# Patient Record
Sex: Male | Born: 1960 | Smoking: Never smoker
Health system: Southern US, Community
[De-identification: ages and names within clinical notes are randomized; demographics above are authoritative.]

## PROBLEM LIST (undated history)

## (undated) DIAGNOSIS — N200 Calculus of kidney: Secondary | ICD-10-CM

## (undated) DIAGNOSIS — G8929 Other chronic pain: Secondary | ICD-10-CM

## (undated) DIAGNOSIS — K635 Polyp of colon: Secondary | ICD-10-CM

## (undated) DIAGNOSIS — T7840XA Allergy, unspecified, initial encounter: Secondary | ICD-10-CM

## (undated) DIAGNOSIS — K921 Melena: Secondary | ICD-10-CM

## (undated) DIAGNOSIS — M1990 Unspecified osteoarthritis, unspecified site: Secondary | ICD-10-CM

## (undated) DIAGNOSIS — M549 Dorsalgia, unspecified: Secondary | ICD-10-CM

## (undated) DIAGNOSIS — K602 Anal fissure, unspecified: Secondary | ICD-10-CM

## (undated) HISTORY — DX: Calculus of kidney: N20.0

## (undated) HISTORY — DX: Anal fissure, unspecified: K60.2

## (undated) HISTORY — DX: Other chronic pain: G89.29

## (undated) HISTORY — DX: Melena: K92.1

## (undated) HISTORY — DX: Polyp of colon: K63.5

## (undated) HISTORY — PX: NOSE SURGERY: SHX723

## (undated) HISTORY — DX: Other chronic pain: M54.9

## (undated) HISTORY — DX: Allergy, unspecified, initial encounter: T78.40XA

## (undated) HISTORY — DX: Unspecified osteoarthritis, unspecified site: M19.90

---

## 1998-02-18 LAB — HM COLONOSCOPY

## 2008-06-24 ENCOUNTER — Emergency Department: Payer: Self-pay | Admitting: Emergency Medicine

## 2008-08-10 ENCOUNTER — Ambulatory Visit: Payer: Self-pay | Admitting: Unknown Physician Specialty

## 2008-09-17 ENCOUNTER — Ambulatory Visit: Payer: Self-pay | Admitting: Anesthesiology

## 2008-10-08 ENCOUNTER — Ambulatory Visit: Payer: Self-pay | Admitting: Anesthesiology

## 2008-11-01 ENCOUNTER — Ambulatory Visit: Payer: Self-pay | Admitting: Anesthesiology

## 2009-02-11 ENCOUNTER — Ambulatory Visit: Payer: Self-pay | Admitting: Anesthesiology

## 2012-02-19 ENCOUNTER — Encounter: Payer: Self-pay | Admitting: Internal Medicine

## 2012-02-19 ENCOUNTER — Ambulatory Visit (INDEPENDENT_AMBULATORY_CARE_PROVIDER_SITE_OTHER): Payer: Managed Care, Other (non HMO) | Admitting: Internal Medicine

## 2012-02-19 VITALS — BP 152/100 | HR 84 | Temp 98.9°F | Ht 72.0 in | Wt 209.5 lb

## 2012-02-19 DIAGNOSIS — R229 Localized swelling, mass and lump, unspecified: Secondary | ICD-10-CM

## 2012-02-19 DIAGNOSIS — Z0000 Encounter for general adult medical examination without abnormal findings: Secondary | ICD-10-CM

## 2012-02-19 DIAGNOSIS — K921 Melena: Secondary | ICD-10-CM

## 2012-02-19 DIAGNOSIS — M25512 Pain in left shoulder: Secondary | ICD-10-CM

## 2012-02-19 DIAGNOSIS — Z1211 Encounter for screening for malignant neoplasm of colon: Secondary | ICD-10-CM

## 2012-02-19 DIAGNOSIS — Z23 Encounter for immunization: Secondary | ICD-10-CM

## 2012-02-19 DIAGNOSIS — M25519 Pain in unspecified shoulder: Secondary | ICD-10-CM

## 2012-02-19 LAB — CBC WITH DIFFERENTIAL/PLATELET
Basophils Absolute: 0 10*3/uL (ref 0.0–0.1)
Basophils Relative: 1 % (ref 0–1)
Eosinophils Absolute: 0.2 10*3/uL (ref 0.0–0.7)
Eosinophils Relative: 4 % (ref 0–5)
HCT: 44.5 % (ref 39.0–52.0)
Hemoglobin: 15.4 g/dL (ref 13.0–17.0)
Lymphocytes Relative: 25 % (ref 12–46)
Lymphs Abs: 1.5 10*3/uL (ref 0.7–4.0)
MCH: 29.6 pg (ref 26.0–34.0)
MCHC: 34.6 g/dL (ref 30.0–36.0)
MCV: 85.4 fL (ref 78.0–100.0)
Monocytes Absolute: 0.5 10*3/uL (ref 0.1–1.0)
Monocytes Relative: 9 % (ref 3–12)
Neutro Abs: 3.6 10*3/uL (ref 1.7–7.7)
Neutrophils Relative %: 61 % (ref 43–77)
Platelets: 233 10*3/uL (ref 150–400)
RBC: 5.21 MIL/uL (ref 4.22–5.81)
RDW: 13.4 % (ref 11.5–15.5)
WBC: 5.8 10*3/uL (ref 4.0–10.5)

## 2012-02-19 LAB — LIPID PANEL
Cholesterol: 203 mg/dL — ABNORMAL HIGH (ref 0–200)
HDL: 43 mg/dL (ref >39)
LDL Cholesterol: 135 mg/dL — ABNORMAL HIGH (ref 0–99)
Total CHOL/HDL Ratio: 4.7 Ratio
Triglycerides: 123 mg/dL (ref <150)
VLDL: 25 mg/dL (ref 0–40)

## 2012-02-19 LAB — COMPREHENSIVE METABOLIC PANEL
ALT: 23 U/L (ref 0–53)
AST: 17 U/L (ref 0–37)
Albumin: 4.3 g/dL (ref 3.5–5.2)
Alkaline Phosphatase: 46 U/L (ref 39–117)
BUN: 15 mg/dL (ref 6–23)
CO2: 31 mEq/L (ref 19–32)
Calcium: 9.5 mg/dL (ref 8.4–10.5)
Chloride: 102 mEq/L (ref 96–112)
Creat: 1.26 mg/dL (ref 0.50–1.35)
Glucose, Bld: 82 mg/dL (ref 70–99)
Potassium: 4.2 mEq/L (ref 3.5–5.3)
Sodium: 142 mEq/L (ref 135–145)
Total Bilirubin: 0.5 mg/dL (ref 0.3–1.2)
Total Protein: 7 g/dL (ref 6.0–8.3)

## 2012-02-19 NOTE — Progress Notes (Signed)
Subjective:    Patient ID: Tony Scott, male    DOB: [birth year], 51 y.o.   MRN: [id number]  HPI 51 year old male presents to establish care. His primary concern today is mass on his right upper chest. He reports that he was scratching his chest to 3 weeks ago when he noticed a lump over his right upper chest. He denies any injury to that area. He denies any other lumps or masses. He does not think that the lump has changed in size. It is not painful. He denies any other symptoms such as fatigue, change in appetite, change in bowel habits, pain. He reports he is generally feeling well.  He also notes a chronic history of intermittent bloody stools. He reports that he has these several times per year and there is typically enough blood to be noticeable in the toilet. There may be an occasional blood clot. He denies constipation or diarrhea. He denies pain with passing stool. His last colonoscopy was approximately 1998. He reports this was normal except for some noncancerous polyps. He was told to followup in 5 years but has not been so. Notably, his mother died of colon cancer.  He also reports several month history of left shoulder pain. He reports that this first began after carrying his luggage for a long period of time. He reports pain and difficulty abducting his arm. He denies any swelling in his left shoulder. He denies any weakness in his left arm.  Outpatient Encounter Prescriptions as of 02/19/2012  Medication Sig Dispense Refill  . Ascorbic Acid (VITAMIN C) 1000 MG tablet Take 1,000 mg by mouth daily.      Marland Kitchen loratadine-pseudoephedrine (CLARITIN-D 24-HOUR) 10-240 MG per 24 hr tablet Take 1 tablet by mouth daily.        Review of Systems  Constitutional: Negative for fever, chills, activity change, appetite change, fatigue and unexpected weight change.  Eyes: Negative for visual disturbance.  Respiratory: Negative for cough and shortness of breath.   Cardiovascular: Negative for chest pain,  palpitations and leg swelling.  Gastrointestinal: Positive for blood in stool. Negative for abdominal pain and abdominal distention.  Genitourinary: Negative for dysuria, urgency and difficulty urinating.  Musculoskeletal: Negative for arthralgias and gait problem.  Skin: Negative for color change and rash.  Hematological: Negative for adenopathy.  Psychiatric/Behavioral: Negative for disturbed wake/sleep cycle and dysphoric mood. The patient is not nervous/anxious.    BP 152/100  Pulse 84  Temp 98.9 F (37.2 C) (Oral)  Ht 6' (1.829 m)  Wt 209 lb 8 oz (95.029 kg)  BMI 28.41 kg/m2  SpO2 98%     Objective:   Physical Exam  Constitutional: He is oriented to person, place, and time. He appears well-developed and well-nourished. No distress.  HENT:  Head: Normocephalic and atraumatic.  Right Ear: External ear normal.  Left Ear: External ear normal.  Nose: Nose normal.  Mouth/Throat: Oropharynx is clear and moist. No oropharyngeal exudate.  Eyes: Conjunctivae and EOM are normal. Pupils are equal, round, and reactive to light. Right eye exhibits no discharge. Left eye exhibits no discharge. No scleral icterus.  Neck: Normal range of motion. Neck supple. No tracheal deviation present. No thyromegaly present.  Cardiovascular: Normal rate, regular rhythm and normal heart sounds.  Exam reveals no gallop and no friction rub.   No murmur heard. Pulmonary/Chest: Effort normal and breath sounds normal. No respiratory distress. He has no wheezes. He has no rales. He exhibits no tenderness. Right breast exhibits mass. Right breast exhibits  no inverted nipple, no nipple discharge, no skin change and no tenderness. Left breast exhibits no inverted nipple, no mass, no nipple discharge, no skin change and no tenderness. Breasts are symmetrical.    Abdominal: Soft. Bowel sounds are normal. He exhibits no distension and no mass. There is no tenderness. There is no guarding.  Musculoskeletal: He exhibits  no edema.       Left shoulder: He exhibits decreased range of motion, tenderness and pain. He exhibits normal strength.  Lymphadenopathy:    He has no cervical adenopathy.       Right: No supraclavicular adenopathy present.       Left: No supraclavicular adenopathy present.  Neurological: He is alert and oriented to person, place, and time. No cranial nerve deficit. Coordination normal.  Skin: Skin is warm and dry. No rash noted. He is not diaphoretic. No erythema. No pallor.  Psychiatric: He has a normal mood and affect. His behavior is normal. Judgment and thought content normal.          Assessment & Plan:

## 2012-02-19 NOTE — Assessment & Plan Note (Signed)
Symptoms concerning for internal hemorrhoids versus diverticulitis versus malignancy. Notably, his mother died from colon cancer. He is overdue for colonoscopy. Will set up GI evaluation with colonoscopy.

## 2012-02-19 NOTE — Assessment & Plan Note (Signed)
Left shoulder pain consistent with rotator cuff tear. No improvement with conservative measures including rest, icing, and nonsteroidals. Will set up MRI left shoulder for further evaluation.

## 2012-02-19 NOTE — Assessment & Plan Note (Signed)
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 Will get general surgical evaluation for biopsy.

## 2012-02-23 ENCOUNTER — Telehealth: Payer: Self-pay | Admitting: Internal Medicine

## 2012-02-23 NOTE — Telephone Encounter (Signed)
Left message for patient informing him that his insurance would not cover his MRI of his shoulder until he has had 4 weeks of physical therapy.

## 2012-03-07 ENCOUNTER — Telehealth: Payer: Self-pay | Admitting: Internal Medicine

## 2012-03-07 DIAGNOSIS — M25519 Pain in unspecified shoulder: Secondary | ICD-10-CM

## 2012-03-07 NOTE — Telephone Encounter (Signed)
The patient is consenting to have physical therapy and if physical therapy does not work he will go forth with the MRI . Could you put in a referral for physical Therapy?

## 2012-03-10 ENCOUNTER — Encounter: Payer: Self-pay | Admitting: Internal Medicine

## 2012-03-13 ENCOUNTER — Encounter: Payer: Self-pay | Admitting: Internal Medicine

## 2012-03-18 ENCOUNTER — Ambulatory Visit: Payer: Managed Care, Other (non HMO) | Admitting: Internal Medicine

## 2012-03-22 ENCOUNTER — Ambulatory Visit: Payer: Managed Care, Other (non HMO) | Admitting: Internal Medicine

## 2012-03-24 ENCOUNTER — Encounter: Payer: Self-pay | Admitting: Internal Medicine

## 2012-03-25 ENCOUNTER — Encounter: Payer: Self-pay | Admitting: Internal Medicine

## 2012-03-25 ENCOUNTER — Ambulatory Visit (INDEPENDENT_AMBULATORY_CARE_PROVIDER_SITE_OTHER): Payer: Managed Care, Other (non HMO) | Admitting: Internal Medicine

## 2012-03-25 VITALS — BP 160/90 | HR 72 | Ht 73.25 in | Wt 211.5 lb

## 2012-03-25 DIAGNOSIS — Z86010 Personal history of colonic polyps: Secondary | ICD-10-CM

## 2012-03-25 DIAGNOSIS — Z1211 Encounter for screening for malignant neoplasm of colon: Secondary | ICD-10-CM

## 2012-03-25 MED ORDER — PEG-KCL-NACL-NASULF-NA ASC-C 100 G PO SOLR: 1.0000 | Freq: Once | ORAL | Status: DC

## 2012-03-25 NOTE — Patient Instructions (Addendum)
You have been scheduled for a colonoscopy with propofol. Please follow written instructions given to you at your visit today.  Please pick up your prep kit at the pharmacy within the next 1-3 days. If you use inhalers (even only as needed), please bring them with you on the day of your procedure.  We have sent the following medications to your pharmacy for you to pick up at your convenience: moviprep  

## 2012-03-25 NOTE — Progress Notes (Signed)
Patient ID: Tony Scott, male   DOB: December 20, 1960, 51 y.o.   MRN: [id number]  SUBJECTIVE: HPI Tony Scott is a 51 yo male with PMH of kidney stones, colon polyps of unclear, anal fissure, chronic back pain who seen in consultation at the request of Dr. Dan Humphreys for evaluation of high risk colon cancer screening. The patient is alone today. Overall he is doing well with out specific GI complaint. He does have a history of intermittent, but rare rectal bleeding. This is bright red when it occurs and painless. In the past he's been told he has anal fissure, but he does not feel this is an issue now. He denies abdominal pain. No nausea or vomiting. No heartburn. No trouble swallowing. No melena. He reports having previously had a colonoscopy in approximately 1997, and he recalls having several "benign polyps" removed. He was advised to followup in 5 years with repeat screening, but this did not occur.  His mother has a history of colorectal cancer around age 73  Review of Systems  As per history of present illness, otherwise negative   Past Medical History  Diagnosis Date  . Blood in stool   . Allergy     Hay fever  . Kidney stones   . Colon polyp   . Back pain, chronic     abnormality L4-5, per pt  . Anal fissure   . Arthritis     Current Outpatient Prescriptions  Medication Sig Dispense Refill  . Ascorbic Acid (VITAMIN C) 1000 MG tablet Take 1,000 mg by mouth daily.      Marland Kitchen loratadine-pseudoephedrine (CLARITIN-D 24-HOUR) 10-240 MG per 24 hr tablet Take 1 tablet by mouth daily.      . Potassium 99 MG TABS Take 1 tablet by mouth daily.      . peg 3350 powder (MOVIPREP) 100 G SOLR Take 1 kit (100 g total) by mouth once.  1 kit  0    No Known Allergies  Family History  Problem Relation Age of Onset  . Colon cancer Mother   . Heart disease Mother   . Hypertension Mother   . Diabetes Mother   . Hyperlipidemia Father   . Heart disease Father   . Hypertension Father   . Diabetes Father   .  Arthritis Maternal Grandmother   . Hyperlipidemia Maternal Grandmother   . Heart disease Maternal Grandmother   . Hypertension Maternal Grandmother   . Arthritis Maternal Grandfather   . Hyperlipidemia Maternal Grandfather   . Heart disease Maternal Grandfather   . Hypertension Maternal Grandfather   . Rheum arthritis Sister     History  Substance Use Topics  . Smoking status: Never Smoker   . Smokeless tobacco: Never Used  . Alcohol Use: Yes     social    OBJECTIVE: BP 160/90  Pulse 72  Ht 6' 1.25" (1.861 m)  Wt 211 lb 8 oz (95.936 kg)  BMI 27.71 kg/m2 Constitutional: Well-developed and well-nourished. No distress. HEENT: Normocephalic and atraumatic. Oropharynx is clear and moist. No oropharyngeal exudate. Conjunctivae are normal. Pupils are equal round and reactive to light. No scleral icterus. Neck: Neck supple. Trachea midline. Cardiovascular: Normal rate, regular rhythm and intact distal pulses. No M/R/G Pulmonary/chest: Effort normal and breath sounds normal. No wheezing, rales or rhonchi. Abdominal: Soft, nontender, nondistended. Bowel sounds active throughout. There are no masses palpable. No hepatosplenomegaly. Extremities: no clubbing, cyanosis, or edema Lymphadenopathy: No cervical adenopathy noted. Neurological: Alert and oriented to person place and time. Skin:  Skin is warm and dry. No rashes noted. Psychiatric: Normal mood and affect. Behavior is normal.  Labs and Imaging -- CBC    Component Value Date/Time   WBC 5.8 02/19/2012 1540   RBC 5.21 02/19/2012 1540   HGB 15.4 02/19/2012 1540   HCT 44.5 02/19/2012 1540   PLT 233 02/19/2012 1540   MCV 85.4 02/19/2012 1540   MCH 29.6 02/19/2012 1540   MCHC 34.6 02/19/2012 1540   RDW 13.4 02/19/2012 1540   LYMPHSABS 1.5 02/19/2012 1540   MONOABS 0.5 02/19/2012 1540   EOSABS 0.2 02/19/2012 1540   BASOSABS 0.0 02/19/2012 1540    CMP     Component Value Date/Time   NA 142 02/19/2012 1540   K 4.2 02/19/2012 1540   CL 102 02/19/2012 1540    CO2 31 02/19/2012 1540   GLUCOSE 82 02/19/2012 1540   BUN 15 02/19/2012 1540   CREATININE 1.26 02/19/2012 1540   CALCIUM 9.5 02/19/2012 1540   PROT 7.0 02/19/2012 1540   ALBUMIN 4.3 02/19/2012 1540   AST 17 02/19/2012 1540   ALT 23 02/19/2012 1540   ALKPHOS 46 02/19/2012 1540   BILITOT 0.5 02/19/2012 1540    ASSESSMENT AND PLAN: 51 yo male with PMH of kidney stones, colon polyps of unclear, anal fissure, chronic back pain who seen in consultation at the request of Dr. Dan Humphreys for evaluation of high risk colon cancer screening.   1. High risk CRC screening/surveillance/hx of colon polyps -- the patient is at elevated risk given his family history of colon cancer in first-degree relatives (mother). He also has a history of colon polyps, subtype unknown, but at this point he is due for colonoscopy either way, and therefore understanding the histology of these polyps is not necessary. He feels he has these records at home, and we'll try to send these to [place] for completeness. I recommended colonoscopy at this time and he is agreeable. We discussed the risks and benefits. Further recommendations can be made after colonoscopy, and his screening interval should be every 5 years. He is aware of this recommendation.

## 2012-03-28 ENCOUNTER — Telehealth: Payer: Self-pay

## 2012-03-28 ENCOUNTER — Encounter: Payer: Self-pay | Admitting: Internal Medicine

## 2012-03-28 ENCOUNTER — Telehealth: Payer: Self-pay | Admitting: Internal Medicine

## 2012-03-28 NOTE — Telephone Encounter (Signed)
I spoke with patient and he stated in the past when he had back pain they gave him a shot of Toradol and Depo-Medrol and it really helped.  At first he stated he just wanted a new Rx for cyclobenzaprine because he also took that in the past, but then he decided he would like to be seen to possibly get an injection of Toradol and Depo-Medrol.  He made an appt for tomorrow.

## 2012-03-28 NOTE — Telephone Encounter (Signed)
Does pt need refill or want new medication?

## 2012-03-28 NOTE — Telephone Encounter (Signed)
Pt came in today wanting to get a referral for physical therpy for back .  When he went to pt for his shoulder this though his back out.   Pt also wanted a rx  For cyclobenzaprine 10mg  or oxycodone 5mg   This work well the last time he had back pain   2009 [facility]  Additional number [id number]

## 2012-03-29 ENCOUNTER — Ambulatory Visit (INDEPENDENT_AMBULATORY_CARE_PROVIDER_SITE_OTHER): Payer: Managed Care, Other (non HMO) | Admitting: Internal Medicine

## 2012-03-29 ENCOUNTER — Encounter: Payer: Self-pay | Admitting: Internal Medicine

## 2012-03-29 ENCOUNTER — Ambulatory Visit (INDEPENDENT_AMBULATORY_CARE_PROVIDER_SITE_OTHER)
Admission: RE | Admit: 2012-03-29 | Discharge: 2012-03-29 | Disposition: A | Discharge status: Home / Self Care | Payer: Managed Care, Other (non HMO) | Source: Ambulatory Visit | Attending: Internal Medicine | Admitting: Internal Medicine

## 2012-03-29 VITALS — BP 160/111 | HR 65 | Temp 98.3°F | Resp 16 | Wt 210.2 lb

## 2012-03-29 DIAGNOSIS — M545 Low back pain, unspecified: Secondary | ICD-10-CM

## 2012-03-29 MED ORDER — CYCLOBENZAPRINE HCL 10 MG PO TABS: 10.0000 mg | ORAL_TABLET | Freq: Three times a day (TID) | ORAL | Status: DC | PRN

## 2012-03-29 MED ORDER — OXYCODONE-ACETAMINOPHEN 5-325 MG PO TABS: 1.0000 | ORAL_TABLET | Freq: Three times a day (TID) | ORAL | Status: DC | PRN

## 2012-03-29 MED ORDER — KETOROLAC TROMETHAMINE 60 MG/2ML IM SOLN
60.0000 mg | Freq: Once | INTRAMUSCULAR | Status: AC
  Administered 2012-03-29: 60 mg via INTRAMUSCULAR

## 2012-03-29 MED ORDER — PREDNISONE (PAK) 10 MG PO TABS: ORAL_TABLET | ORAL | Status: DC

## 2012-03-29 NOTE — Assessment & Plan Note (Addendum)
Patient with severe lower back pain secondary to degenerative changes in the lumbar spine. X-ray today shows chronic degenerative changes. Will get MRI of the lumbar spine for further evaluation. Given that he has had no improvement with conservative measures including chiropractic care, anti-inflammatory medicines, physical therapy, question if he might benefit from surgical intervention or epidural steroid injections. Will give shot of Toradol today. Will also start Prednisone taper, Flexeril and use Percocet as needed for severe pain. Followup 4 weeks.

## 2012-03-29 NOTE — Progress Notes (Signed)
Subjective:    Patient ID: Tony Scott, male    DOB: August 28, 1960, 51 y.o.   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 Pain is described as aching in the low back. It does not radiate generally to his legs. There is no weakness in the legs. There is no loss of control of bowel or bladder. Patient has been evaluated extensively in the past with x-rays, chiropractic care, physical therapy, and treatment with anti-inflammatory medications. Symptoms have been well-controlled over the last 10-20 years for the most part but intermittently he will have exacerbations of pain. Over the last couple of weeks pain has been severe and limited ability to function. He denies any recent trauma to his back. Pain is worsened by leaning forward.  Outpatient Encounter Prescriptions as of 03/29/2012  Medication Sig Dispense Refill  . Ascorbic Acid (VITAMIN C) 1000 MG tablet Take 1,000 mg by mouth daily.      Marland Kitchen loratadine-pseudoephedrine (CLARITIN-D 24-HOUR) 10-240 MG per 24 hr tablet Take 1 tablet by mouth daily.      . Potassium 99 MG TABS Take 1 tablet by mouth daily.      . cyclobenzaprine (FLEXERIL) 10 MG tablet Take 1 tablet (10 mg total) by mouth 3 (three) times daily as needed for muscle spasms.  90 tablet  3  . oxyCODONE-acetaminophen (ROXICET) 5-325 MG per tablet Take 1 tablet by mouth every 8 (eight) hours as needed for pain.  20 tablet  0  . peg 3350 powder (MOVIPREP) 100 G SOLR Take 1 kit (100 g total) by mouth once.  1 kit  0  . predniSONE (STERAPRED UNI-PAK) 10 MG tablet Take 60mg  day 1 then taper by 10mg  daily  21 tablet  0   Facility-Administered Encounter Medications as of 03/29/2012  Medication Dose Route Frequency Provider Last Rate Last Dose  . ketorolac (TORADOL) injection 60 mg  60 mg Intramuscular  Once Shelia Media, MD   60 mg at 03/29/12 1216   BP 160/111  Pulse 65  Temp 98.3 F (36.8 C) (Oral)  Resp 16  Wt 210 lb 4 oz (95.369 kg)  SpO2 99%  Review of Systems  Constitutional: Negative for fever, chills and fatigue.  Musculoskeletal: Positive for myalgias, back pain and arthralgias. Negative for joint swelling and gait problem.  Neurological: Negative for dizziness, weakness, numbness and headaches.       Objective:   Physical Exam  Constitutional: He is oriented to person, place, and time. He appears well-developed and well-nourished. No distress.  HENT:  Head: Normocephalic and atraumatic.  Right Ear: External ear normal.  Left Ear: External ear normal.  Nose: Nose normal.  Mouth/Throat: Oropharynx is clear and moist. No oropharyngeal exudate.  Eyes: Conjunctivae normal and EOM are normal. Pupils are equal, round, and reactive to light. Right eye exhibits no discharge. Left eye exhibits no discharge. No scleral icterus.  Neck: Normal range of motion. Neck supple. No tracheal deviation present. No thyromegaly present.  Pulmonary/Chest: Effort normal.  Musculoskeletal: He exhibits no edema.       Lumbar back: He exhibits decreased range of motion, tenderness, pain and spasm. He exhibits no edema.  Lymphadenopathy:    He has no cervical adenopathy.  Neurological: He is alert and oriented to person, place, and time. No cranial nerve deficit. Coordination normal.  Skin: Skin is warm and dry. No rash noted. He is not diaphoretic. No erythema. No pallor.  Psychiatric: He has a normal mood and affect. His behavior is normal. Judgment and thought content normal.          Assessment & Plan:

## 2012-04-01 ENCOUNTER — Encounter: Payer: Self-pay | Admitting: Internal Medicine

## 2012-04-01 ENCOUNTER — Ambulatory Visit (AMBULATORY_SURGERY_CENTER): Payer: Managed Care, Other (non HMO) | Admitting: Internal Medicine

## 2012-04-01 VITALS — BP 154/110 | HR 57 | Temp 97.4°F | Resp 18 | Ht 73.0 in | Wt 211.0 lb

## 2012-04-01 DIAGNOSIS — D126 Benign neoplasm of colon, unspecified: Secondary | ICD-10-CM

## 2012-04-01 DIAGNOSIS — Z800 Family history of malignant neoplasm of digestive organs: Secondary | ICD-10-CM

## 2012-04-01 DIAGNOSIS — Z1211 Encounter for screening for malignant neoplasm of colon: Secondary | ICD-10-CM

## 2012-04-01 DIAGNOSIS — Z86010 Personal history of colonic polyps: Secondary | ICD-10-CM

## 2012-04-01 LAB — HM COLONOSCOPY

## 2012-04-01 MED ORDER — SODIUM CHLORIDE 0.9 % IV SOLN: 500.0000 mL | INTRAVENOUS | Status: DC

## 2012-04-01 NOTE — Op Note (Signed)
[facility] [address] [place], [address]   COLONOSCOPY PROCEDURE REPORT  PATIENT: Tony Scott, Tony Scott  MR#: [id number] BIRTHDATE: [birth year] , 51  yrs. old GENDER: Male ENDOSCOPIST: Beverley Fiedler, MD REFERRED [id number], Jennifer PROCEDURE DATE:  04/01/2012 PROCEDURE:   Colonoscopy with snare polypectomy ASA CLASS:   Class II INDICATIONS:elevated risk screening, patient's immediate family history of colon cancer, and high risk patient with personal history of colonic polyps. MEDICATIONS: MAC sedation, administered by CRNA and Propofol (Diprivan) 240 mg IV  DESCRIPTION OF PROCEDURE:   After the risks benefits and alternatives of the procedure were thoroughly explained, informed consent was obtained.  A digital rectal exam revealed no rectal mass.   The LB [id number]  endoscope was introduced through the anus and advanced to the terminal ileum which was intubated for a short distance. No adverse events experienced.   The quality of the prep was good, using MoviPrep  The instrument was then slowly withdrawn as the colon was fully examined.   COLON FINDINGS: Three sessile polyps measuring 4-5 mm in size were found in the transverse colon and rectum.  Polypectomy was performed using cold snare.  All resections were complete and all polyp tissue was completely retrieved.   Moderate diverticulosis was noted in the descending colon and sigmoid colon.   Small internal hemorrhoids were found.   The mucosa appeared normal in the terminal ileum.  Retroflexed views revealed internal hemorrhoids. The time to cecum=3 minutes 04 seconds.  Withdrawal time=11 minutes 07 seconds.  The scope was withdrawn and the procedure completed. COMPLICATIONS: There were no complications.  ENDOSCOPIC IMPRESSION: 1.   Three sessile polyps measuring 4-5 mm in size were found in the transverse colon and rectum; Polypectomy was performed using cold snare 2.   Moderate diverticulosis was  noted in the descending colon and sigmoid colon 3.   Small internal hemorrhoids 4.   Normal mucosa in the terminal ileum  RECOMMENDATIONS: 1.  Hold aspirin, aspirin products, and anti-inflammatory medication for 1 week. 2.  High fiber diet 3.  If the polyps removed today are proven to be adenomatous (pre-cancerous) polyps, you will need a colonoscopy in 3 years. Otherwise, given your significant family history of colon cancer, you should have a repeat colonoscopy in 5 year.  You will receive a letter within 1-2 weeks with the results of your biopsy as well as final recommendations.  Please call my office if you have not received a letter after 3 weeks.    eSigned:  Beverley Fiedler, MD 04/01/2012 12:31 PM   cc: Ronna Polio MD and The Patient   PATIENT NAME:  Tony Scott, Tony Scott MR#: [id number]

## 2012-04-01 NOTE — Progress Notes (Addendum)
Patient did not have preoperative order for IV antibiotic SSI prophylaxis. (G8918)  Patient did not experience any of the following events: a burn prior to discharge; a fall within the facility; wrong site/side/patient/procedure/implant event; or a hospital transfer or hospital admission upon discharge from the facility. (G8907)  

## 2012-04-01 NOTE — Patient Instructions (Signed)
YOU HAD AN ENDOSCOPIC PROCEDURE TODAY AT THE  ENDOSCOPY CENTER: Refer to the procedure report that was given to you for any specific questions about what was found during the examination.  If the procedure report does not answer your questions, please call your gastroenterologist to clarify.  If you requested that your care partner not be given the details of your procedure findings, then the procedure report has been included in a sealed envelope for you to review at your convenience later.  YOU SHOULD EXPECT: Some feelings of bloating in the abdomen. Passage of more gas than usual.  Walking can help get rid of the air that was put into your GI tract during the procedure and reduce the bloating. If you had a lower endoscopy (such as a colonoscopy or flexible sigmoidoscopy) you may notice spotting of blood in your stool or on the toilet paper. If you underwent a bowel prep for your procedure, then you may not have a normal bowel movement for a few days.  DIET: Your first meal following the procedure should be a light meal and then it is ok to progress to your normal diet.  A half-sandwich or bowl of soup is an example of a good first meal.  Heavy or fried foods are harder to digest and may make you feel nauseous or bloated.  Likewise meals heavy in dairy and vegetables can cause extra gas to form and this can also increase the bloating.  Drink plenty of fluids but you should avoid alcoholic beverages for 24 hours.  ACTIVITY: Your care partner should take you home directly after the procedure.  You should plan to take it easy, moving slowly for the rest of the day.  You can resume normal activity the day after the procedure however you should NOT DRIVE or use heavy machinery for 24 hours (because of the sedation medicines used during the test).    SYMPTOMS TO REPORT IMMEDIATELY: A gastroenterologist can be reached at any hour.  During normal business hours, 8:30 AM to 5:00 PM Monday through Friday,  call [phone].  After hours and on weekends, please call the GI answering service at [phone] who will take a message and have the physician on call contact you.   Following lower endoscopy (colonoscopy or flexible sigmoidoscopy):  Excessive amounts of blood in the stool  Significant tenderness or worsening of abdominal pains  Swelling of the abdomen that is new, acute  Fever of 100F or higher    FOLLOW UP: If any biopsies were taken you will be contacted by phone or by letter within the next 1-3 weeks.  Call your gastroenterologist if you have not heard about the biopsies in 3 weeks.  Our staff will call the home number listed on your records the next business day following your procedure to check on you and address any questions or concerns that you may have at that time regarding the information given to you following your procedure. This is a courtesy call and so if there is no answer at the home number and we have not heard from you through the emergency physician on call, we will assume that you have returned to your regular daily activities without incident.  SIGNATURES/CONFIDENTIALITY: You and/or your care partner have signed paperwork which will be entered into your electronic medical record.  These signatures attest to the fact that that the information above on your After Visit Summary has been reviewed and is understood.  Full responsibility of the confidentiality   of this discharge information lies with you and/or your care-partner.    INFORMATION ON POLYPS ,DIVERTICULOSIS, HEMORRHOIDS, &HIGH FIBER DIET GIVEN TO YOU TODAY  

## 2012-04-04 ENCOUNTER — Telehealth: Payer: Self-pay | Admitting: *Deleted

## 2012-04-04 NOTE — Telephone Encounter (Signed)
TELEPHONE DISCONNECTED, UNABLE TO LEAVE MESSAGE.

## 2012-04-05 ENCOUNTER — Encounter: Payer: Self-pay | Admitting: Internal Medicine

## 2012-04-11 ENCOUNTER — Encounter: Payer: Self-pay | Admitting: Internal Medicine

## 2012-04-11 DIAGNOSIS — M549 Dorsalgia, unspecified: Secondary | ICD-10-CM

## 2012-05-06 ENCOUNTER — Encounter: Payer: Managed Care, Other (non HMO) | Admitting: Internal Medicine

## 2012-05-10 ENCOUNTER — Encounter: Payer: Self-pay | Admitting: Internal Medicine

## 2012-05-11 ENCOUNTER — Telehealth: Payer: Self-pay

## 2012-05-11 NOTE — Telephone Encounter (Signed)
Pt request cd of back xray to be picked up after 05/11/12 by girlfriend Vicky.

## 2012-07-04 ENCOUNTER — Encounter: Payer: Self-pay | Admitting: Internal Medicine

## 2012-07-04 ENCOUNTER — Ambulatory Visit (INDEPENDENT_AMBULATORY_CARE_PROVIDER_SITE_OTHER): Payer: Managed Care, Other (non HMO) | Admitting: Internal Medicine

## 2012-07-04 VITALS — BP 140/82 | HR 81 | Temp 98.4°F | Resp 16 | Wt 214.5 lb

## 2012-07-04 DIAGNOSIS — J0100 Acute maxillary sinusitis, unspecified: Secondary | ICD-10-CM

## 2012-07-04 MED ORDER — AMOXICILLIN-POT CLAVULANATE 875-125 MG PO TABS: 1.0000 | ORAL_TABLET | Freq: Two times a day (BID) | ORAL | Status: DC

## 2012-07-04 NOTE — Assessment & Plan Note (Signed)
Symptoms most consistent with maxillary sinusitis. Will treat with Augmentin. Patient will restart Claritin-D. He will use ibuprofen as needed for pain. He will call if symptoms are not improving over the next 72 hours.

## 2012-07-04 NOTE — Progress Notes (Signed)
Subjective:    Patient ID: Tony Scott, male    DOB: 1960/09/26, 51 y.o.   MRN: [id number]  HPI 51 year old male with history of recurrent sinusitis presents for acute visit complaining of 2-3 week history of right-sided maxillary sinus pain and pressure. He reports that symptoms have been intermittent over the last couple of weeks. He has had some pain across his face with purulent nasal drainage. He also has occasional ear pain. Symptoms are consistent with previous episodes of sinusitis. He denies any fever, chills, cough, shortness of breath. He has not been taking any medication for symptoms.  Outpatient Encounter Prescriptions as of 07/04/2012  Medication Sig Dispense Refill  . Ascorbic Acid (VITAMIN C) 1000 MG tablet Take 1,000 mg by mouth daily.      Marland Kitchen loratadine-pseudoephedrine (CLARITIN-D 24-HOUR) 10-240 MG per 24 hr tablet Take 1 tablet by mouth daily.      . Potassium 99 MG TABS Take 1 tablet by mouth daily.      Marland Kitchen amoxicillin-clavulanate (AUGMENTIN) 875-125 MG per tablet Take 1 tablet by mouth 2 (two) times daily.  20 tablet  0  . cyclobenzaprine (FLEXERIL) 10 MG tablet Take 1 tablet (10 mg total) by mouth 3 (three) times daily as needed for muscle spasms.  90 tablet  3  . oxyCODONE-acetaminophen (ROXICET) 5-325 MG per tablet Take 1 tablet by mouth every 8 (eight) hours as needed for pain.  20 tablet  0  . [DISCONTINUED] predniSONE (STERAPRED UNI-PAK) 10 MG tablet Take 60mg  day 1 then taper by 10mg  daily  21 tablet  0   BP 140/82  Pulse 81  Temp 98.4 F (36.9 C) (Oral)  Resp 16  Wt 214 lb 8 oz (97.297 kg)  SpO2 95%   Review of Systems  Constitutional: Negative for fever, chills, activity change, appetite change, fatigue and unexpected weight change.  HENT: Positive for congestion, postnasal drip and sinus pressure.   Eyes: Negative for visual disturbance.  Respiratory: Negative for cough and shortness of breath.   Cardiovascular: Negative for chest pain, palpitations and  leg swelling.  Gastrointestinal: Negative for abdominal pain and abdominal distention.  Genitourinary: Negative for dysuria, urgency and difficulty urinating.  Musculoskeletal: Negative for arthralgias and gait problem.  Skin: Negative for color change and rash.  Hematological: Negative for adenopathy.  Psychiatric/Behavioral: Negative for sleep disturbance and dysphoric mood. The patient is not nervous/anxious.        Objective:   Physical Exam  Constitutional: He is oriented to person, place, and time. He appears well-developed and well-nourished. No distress.  HENT:  Head: Normocephalic and atraumatic.  Right Ear: External ear normal. A middle ear effusion is present.  Left Ear: External ear normal. A middle ear effusion is present.  Nose: Mucosal edema present. Right sinus exhibits maxillary sinus tenderness.  Mouth/Throat: Oropharynx is clear and moist. No oropharyngeal exudate.  Eyes: Conjunctivae normal and EOM are normal. Pupils are equal, round, and reactive to light. Right eye exhibits no discharge. Left eye exhibits no discharge. No scleral icterus.  Neck: Normal range of motion. Neck supple. No tracheal deviation present. No thyromegaly present.  Cardiovascular: Normal rate, regular rhythm and normal heart sounds.  Exam reveals no gallop and no friction rub.   No murmur heard. Pulmonary/Chest: Effort normal and breath sounds normal. No respiratory distress. He has no wheezes. He has no rales. He exhibits no tenderness.  Musculoskeletal: Normal range of motion. He exhibits no edema.  Lymphadenopathy:    He has no cervical adenopathy.  Neurological: He is alert and oriented to person, place, and time. No cranial nerve deficit. Coordination normal.  Skin: Skin is warm and dry. No rash noted. He is not diaphoretic. No erythema. No pallor.  Psychiatric: He has a normal mood and affect. His behavior is normal. Judgment and thought content normal.          Assessment & Plan:

## 2012-08-27 ENCOUNTER — Other Ambulatory Visit: Payer: Self-pay

## 2013-03-09 ENCOUNTER — Encounter: Payer: Self-pay | Admitting: Internal Medicine

## 2013-03-09 ENCOUNTER — Ambulatory Visit (INDEPENDENT_AMBULATORY_CARE_PROVIDER_SITE_OTHER): Payer: Managed Care, Other (non HMO) | Admitting: Internal Medicine

## 2013-03-09 VITALS — BP 120/90 | HR 69 | Temp 98.8°F | Ht 73.0 in | Wt 214.0 lb

## 2013-03-09 DIAGNOSIS — R030 Elevated blood-pressure reading, without diagnosis of hypertension: Secondary | ICD-10-CM

## 2013-03-09 DIAGNOSIS — Z0000 Encounter for general adult medical examination without abnormal findings: Secondary | ICD-10-CM | POA: Insufficient documentation

## 2013-03-09 DIAGNOSIS — IMO0001 Reserved for inherently not codable concepts without codable children: Secondary | ICD-10-CM

## 2013-03-09 NOTE — Progress Notes (Signed)
  Subjective:    Patient ID: Tony Scott, male    DOB: April 27, 1961, 52 y.o.   MRN: [id number]  HPI 52YO male with h/o chronic low back pain presents for annual exam. Doing well. No concerns today. Tries to follow healthy diet. Exercises by playing golf. UTD on health maintenance, including colonoscopy which was performed 03/2012.  Outpatient Encounter Prescriptions as of 03/09/2013  Medication Sig Dispense Refill  . Ascorbic Acid (VITAMIN C) 1000 MG tablet Take 1,000 mg by mouth daily.      Marland Kitchen loratadine-pseudoephedrine (CLARITIN-D 24-HOUR) 10-240 MG per 24 hr tablet Take 1 tablet by mouth daily.      . Misc Natural Products (OSTEO BI-FLEX JOINT SHIELD PO) Take by mouth.      . Potassium 99 MG TABS Take 1 tablet by mouth daily.       No facility-administered encounter medications on file as of 03/09/2013.   BP 120/90  Pulse 69  Temp(Src) 98.8 F (37.1 C) (Oral)  Ht 6\' 1"  (1.854 m)  Wt 214 lb (97.07 kg)  BMI 28.24 kg/m2  SpO2 97%  Review of Systems  Constitutional: Negative for fever, chills, activity change, appetite change, fatigue and unexpected weight change.  Eyes: Negative for visual disturbance.  Respiratory: Negative for cough and shortness of breath.   Cardiovascular: Negative for chest pain, palpitations and leg swelling.  Gastrointestinal: Negative for abdominal pain and abdominal distention.  Genitourinary: Negative for dysuria, urgency and difficulty urinating.  Musculoskeletal: Positive for myalgias, back pain and arthralgias. Negative for gait problem.  Skin: Negative for color change and rash.  Hematological: Negative for adenopathy.  Psychiatric/Behavioral: Negative for sleep disturbance and dysphoric mood. The patient is not nervous/anxious.        Objective:   Physical Exam  Constitutional: He is oriented to person, place, and time. He appears well-developed and well-nourished. No distress.  HENT:  Head: Normocephalic and atraumatic.  Right Ear: External ear  normal.  Left Ear: External ear normal.  Nose: Nose normal.  Mouth/Throat: Oropharynx is clear and moist. No oropharyngeal exudate.  Eyes: Conjunctivae and EOM are normal. Pupils are equal, round, and reactive to light. Right eye exhibits no discharge. Left eye exhibits no discharge. No scleral icterus.  Neck: Normal range of motion. Neck supple. No tracheal deviation present. No thyromegaly present.  Cardiovascular: Normal rate, regular rhythm and normal heart sounds.  Exam reveals no gallop and no friction rub.   No murmur heard. Pulmonary/Chest: Effort normal and breath sounds normal. No respiratory distress. He has no wheezes. He has no rales. He exhibits no tenderness.  Abdominal: Soft. Bowel sounds are normal. He exhibits no distension. There is no tenderness. There is no rebound and no guarding.  Musculoskeletal: Normal range of motion. He exhibits no edema.  Lymphadenopathy:    He has no cervical adenopathy.  Neurological: He is alert and oriented to person, place, and time. No cranial nerve deficit. Coordination normal.  Skin: Skin is warm and dry. No rash noted. He is not diaphoretic. No erythema. No pallor.  Psychiatric: He has a normal mood and affect. His behavior is normal. Judgment and thought content normal.          Assessment & Plan:

## 2013-03-09 NOTE — Assessment & Plan Note (Signed)
General medical exam normal today. Health maintenance is UTD. Encouraged continued effort at healthy diet, low in saturated fat and processed carbohydrates, high in fiber. Encouraged goal of exercise 3x per week. Will check CMP, lipids with labs today.

## 2013-03-09 NOTE — Assessment & Plan Note (Signed)
BP Readings from Last 3 Encounters:  03/09/13 120/90  07/04/12 140/82  04/01/12 154/110   BP slightly elevated today and has been elevated in the past. Will have pt monitor at home and call if BP consistently >140/90. Will also have him return for BP recheck in 1-2 weeks. Will check renal function with labs.

## 2013-03-10 LAB — COMPREHENSIVE METABOLIC PANEL
ALT: 33 U/L (ref 0–53)
AST: 26 U/L (ref 0–37)
Albumin: 4.7 g/dL (ref 3.5–5.2)
Alkaline Phosphatase: 44 U/L (ref 39–117)
BUN: 17 mg/dL (ref 6–23)
CO2: 32 mEq/L (ref 19–32)
Calcium: 9.9 mg/dL (ref 8.4–10.5)
Chloride: 105 mEq/L (ref 96–112)
Creatinine, Ser: 1 mg/dL (ref 0.4–1.5)
GFR: 85.82 mL/min (ref >60.00)
Glucose, Bld: 89 mg/dL (ref 70–99)
Potassium: 5.3 mEq/L — ABNORMAL HIGH (ref 3.5–5.1)
Sodium: 141 mEq/L (ref 135–145)
Total Bilirubin: 1.3 mg/dL — ABNORMAL HIGH (ref 0.3–1.2)
Total Protein: 7.9 g/dL (ref 6.0–8.3)

## 2013-03-10 LAB — LIPID PANEL
Cholesterol: 214 mg/dL — ABNORMAL HIGH (ref 0–200)
HDL: 48.8 mg/dL (ref >39.00)
Total CHOL/HDL Ratio: 4
Triglycerides: 90 mg/dL (ref 0.0–149.0)
VLDL: 18 mg/dL (ref 0.0–40.0)

## 2013-03-10 LAB — LDL CHOLESTEROL, DIRECT: Direct LDL: 165.3 mg/dL

## 2013-03-10 LAB — PSA, TOTAL AND FREE
PSA, Free Pct: 33 % (ref >25)
PSA, Free: 0.46 ng/mL
PSA: 1.41 ng/mL (ref <=4.00)

## 2013-03-20 ENCOUNTER — Telehealth: Payer: Self-pay | Admitting: *Deleted

## 2013-03-20 ENCOUNTER — Other Ambulatory Visit (INDEPENDENT_AMBULATORY_CARE_PROVIDER_SITE_OTHER): Payer: Managed Care, Other (non HMO)

## 2013-03-20 DIAGNOSIS — R7402 Elevation of levels of lactic acid dehydrogenase (LDH): Secondary | ICD-10-CM

## 2013-03-20 LAB — COMPREHENSIVE METABOLIC PANEL
ALT: 23 U/L (ref 0–53)
AST: 19 U/L (ref 0–37)
Albumin: 4.4 g/dL (ref 3.5–5.2)
Alkaline Phosphatase: 50 U/L (ref 39–117)
BUN: 17 mg/dL (ref 6–23)
CO2: 30 mEq/L (ref 19–32)
Calcium: 9.7 mg/dL (ref 8.4–10.5)
Chloride: 106 mEq/L (ref 96–112)
Creatinine, Ser: 0.9 mg/dL (ref 0.4–1.5)
GFR: 99.77 mL/min (ref >60.00)
Glucose, Bld: 90 mg/dL (ref 70–99)
Potassium: 4.9 mEq/L (ref 3.5–5.1)
Sodium: 142 mEq/L (ref 135–145)
Total Bilirubin: 0.9 mg/dL (ref 0.3–1.2)
Total Protein: 7 g/dL (ref 6.0–8.3)

## 2013-03-20 NOTE — Telephone Encounter (Signed)
CMP 790.4 

## 2013-03-20 NOTE — Telephone Encounter (Signed)
What labs and dx? Is it just for a cmp? If so what dx?

## 2013-05-18 ENCOUNTER — Other Ambulatory Visit: Payer: Self-pay

## 2013-09-04 ENCOUNTER — Encounter: Payer: Self-pay | Admitting: Adult Health

## 2013-09-04 ENCOUNTER — Ambulatory Visit (INDEPENDENT_AMBULATORY_CARE_PROVIDER_SITE_OTHER): Payer: Managed Care, Other (non HMO) | Admitting: Adult Health

## 2013-09-04 ENCOUNTER — Ambulatory Visit (INDEPENDENT_AMBULATORY_CARE_PROVIDER_SITE_OTHER)
Admission: RE | Admit: 2013-09-04 | Discharge: 2013-09-04 | Disposition: A | Discharge status: Home / Self Care | Payer: Managed Care, Other (non HMO) | Source: Ambulatory Visit | Attending: Adult Health | Admitting: Adult Health

## 2013-09-04 VITALS — BP 118/82 | HR 66 | Temp 98.1°F | Resp 14 | Wt 191.0 lb

## 2013-09-04 DIAGNOSIS — R222 Localized swelling, mass and lump, trunk: Secondary | ICD-10-CM

## 2013-09-04 DIAGNOSIS — M542 Cervicalgia: Secondary | ICD-10-CM | POA: Insufficient documentation

## 2013-09-04 NOTE — Progress Notes (Signed)
   Subjective:    Patient ID: Tony Scott, male    DOB: Dec 01, 1960, 53 y.o.   MRN: [id number]  HPI  Pt is a pleasant 53 y/o male who presents with the following concerns:  1) He has recently noticed a lump at the end of the breast bone. Initially it was tender but no longer so. 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  2) He has pain in the lower cervical spine/thoracic spine that has recently developed. He reports pain as sharp. He reports flying on a weekly basis and noticed while he was looking downward while reading, etc. There are not palpable areas on his neck reported.  Review of Systems  Musculoskeletal: Positive for neck pain.       Hard area at the end of breast bone  Neurological: Negative for numbness.  All other systems reviewed and are negative.       Objective:   Physical Exam  Constitutional: He is oriented to person, place, and time. He appears well-developed and well-nourished. No distress.  Cardiovascular: Normal rate and regular rhythm.   Pulmonary/Chest: Effort normal. No respiratory distress.  Musculoskeletal: Normal range of motion. He exhibits no edema and no tenderness.  Hard, palpable mass at the area of xyphoid process. Neck with good ROM. Palpable area that is tender to touch at the lower end of cervical spine top of thoracic spine.  Neurological: He is alert and oriented to person, place, and time.  Skin: Skin is warm and dry.  Psychiatric: He has a normal mood and affect. His behavior is normal. Judgment and thought content normal.   BP 118/82  Pulse 66  Temp(Src) 98.1 F (36.7 C) (Oral)  Resp 14  Wt 191 lb (86.637 kg)  SpO2 99%      Assessment & Plan:   1. Neck pain Xray area to evaluate for arthritis, narrowing or loss of disc space. Appears to be positional. - DG Cervical Spine Complete; Future  2. Mass in chest Mass in the area of xyphoid process. Evaluate for bony abnormality vs benign  finding - osteoma.   - DG Chest Port 1 View; Future

## 2013-09-04 NOTE — Progress Notes (Signed)
Pre visit review using our clinic review tool, if applicable. No additional management support is needed unless otherwise documented below in the visit note. 

## 2013-10-20 ENCOUNTER — Ambulatory Visit: Payer: Self-pay | Admitting: Internal Medicine

## 2014-01-31 ENCOUNTER — Telehealth: Payer: Self-pay | Admitting: Internal Medicine

## 2014-01-31 NOTE — Telephone Encounter (Signed)
Patient Information:  Caller Name: Vanessa  Phone: [phone]  Patient: Tony Scott, Tony Scott  Gender: Male  DOB: 12-26-1960  Age: 53 Years  PCP: Ronette Deter (Adults only)  Office Follow Up:  Does the office need to follow up with this patient?: No  Instructions For The Office: N/A  RN Note:  Reviewed Home care advice regarding Gout  and call back parameters with patient. Understanding expressed.  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  Please review.  he is currently out of town. Will be back Thursday 02/01/14.  Symptoms  Reason For Call & Symptoms: Patient states he thinks he  has Gout in left great toe.  He has never had Gout before, but symptoms match Web MD. Onset Saturday evening 01/27/14.  He states he has been emailing via My Chart but has not heard back. He is limping but can walk. Foot is swollen but pain is in great toe. No injury.   He is currently out of town.  He is scheduled to be seen Friday 02/02/14  at 11:00  Reviewed Health History In EMR: Yes  Reviewed Medications In EMR: Yes  Reviewed Allergies In EMR: Yes  Reviewed Surgeries / Procedures: Yes  Date of Onset of Symptoms: 01/31/2014  Guideline(s) Used:  Toe Pain  Disposition Per Guideline:   See Within 3 Days in Office  Reason For Disposition Reached:   Pain in the big toe joint  Advice Given:  Pain Medicines:  For pain relief, you can take either acetaminophen, ibuprofen, or naproxen.  They are over-the-counter (OTC) pain drugs. You can buy them at the drugstore.  Ibuprofen (e.g., Motrin, Advil):  Another choice is to take 600 mg (three 200 mg pills) by mouth every 8 hours.  Call Back If:  Swelling, redness, or fever occur  Severe pain not relieved by pain medication  You become worse.  Wear Shoes That Fit:  Shoes should have a wide toe box, so that your toes do not feel cramped.  RN Overrode Recommendation:  Document Patient  Patient reports he has appt on Friday 02/02/14

## 2014-01-31 NOTE — Telephone Encounter (Signed)
Appt scheduled Friday with Raquel

## 2014-02-02 ENCOUNTER — Encounter: Payer: Self-pay | Admitting: Adult Health

## 2014-02-02 ENCOUNTER — Ambulatory Visit (INDEPENDENT_AMBULATORY_CARE_PROVIDER_SITE_OTHER): Payer: Managed Care, Other (non HMO) | Admitting: Adult Health

## 2014-02-02 VITALS — BP 143/89 | HR 68 | Temp 98.4°F | Resp 14 | Wt 188.8 lb

## 2014-02-02 DIAGNOSIS — M2550 Pain in unspecified joint: Secondary | ICD-10-CM

## 2014-02-02 DIAGNOSIS — R252 Cramp and spasm: Secondary | ICD-10-CM

## 2014-02-02 DIAGNOSIS — M79675 Pain in left toe(s): Secondary | ICD-10-CM

## 2014-02-02 DIAGNOSIS — M79609 Pain in unspecified limb: Secondary | ICD-10-CM

## 2014-02-02 LAB — URIC ACID: Uric Acid, Serum: 6.8 mg/dL (ref 4.0–7.8)

## 2014-02-02 LAB — BASIC METABOLIC PANEL
BUN: 19 mg/dL (ref 6–23)
CO2: 33 mEq/L — ABNORMAL HIGH (ref 19–32)
Calcium: 9.9 mg/dL (ref 8.4–10.5)
Chloride: 101 mEq/L (ref 96–112)
Creatinine, Ser: 0.9 mg/dL (ref 0.4–1.5)
GFR: 91.98 mL/min (ref >60.00)
Glucose, Bld: 89 mg/dL (ref 70–99)
Potassium: 5.4 mEq/L — ABNORMAL HIGH (ref 3.5–5.1)
Sodium: 141 mEq/L (ref 135–145)

## 2014-02-02 LAB — MAGNESIUM: Magnesium: 2.1 mg/dL (ref 1.5–2.5)

## 2014-02-02 NOTE — Progress Notes (Signed)
Patient ID: Tony Scott, male   DOB: [birth year], 53 y.o.   MRN: [id number]    Subjective:    Patient ID: Tony Scott, male    DOB: 05-10-1961, 53 y.o.   MRN: [id number]  HPI  Pt presents to clinic with painful left great toe. He denies injury to the area. The joint is red and slightly swollen. Has been taking ibuprofen.  Past Medical History  Diagnosis Date  . Blood in stool   . Allergy     Hay fever  . Colon polyp   . Back pain, chronic     abnormality L4-5, per pt  . Anal fissure   . Arthritis   . Kidney stones     Current Outpatient Prescriptions on File Prior to Visit  Medication Sig Dispense Refill  . loratadine-pseudoephedrine (CLARITIN-D 24-HOUR) 10-240 MG per 24 hr tablet Take 1 tablet by mouth daily.       No current facility-administered medications on file prior to visit.     Review of Systems  Musculoskeletal: Positive for arthralgias.       Daughter recently diagnosed with RA. Pt reports nocturnal muscle cramping.   Positive for pain in left great toe joint. Negative for fever, injury    Objective:  BP 143/89  Pulse 68  Temp(Src) 98.4 F (36.9 C) (Oral)  Resp 14  Wt 188 lb 12 oz (85.616 kg)  SpO2 100%   Physical Exam  Constitutional: He is oriented to person, place, and time. He appears well-developed and well-nourished. No distress.  HENT:  Head: Normocephalic and atraumatic.  Eyes: Conjunctivae and EOM are normal.  Neck: Neck supple.  Cardiovascular: Normal rate and regular rhythm.   Pulmonary/Chest: Effort normal. No respiratory distress.  Musculoskeletal: Normal range of motion. He exhibits tenderness.  Left great toe joint erythema and mild swelling.   Neurological: He is alert and oriented to person, place, and time. Coordination normal.  Skin: Skin is warm and dry.  Psychiatric: He has a normal mood and affect. His behavior is normal. Judgment and thought content normal.       Assessment & Plan:   1. Toe pain, left Check uric  acid levels. Has been taking ibuprofen with relief of symptoms. Provided with information on gout and ways to help prevent/control. - Uric acid  2. Muscle cramp, nocturnal Check labs. Needs to stay hydrated. Stretching calf muscle prior to bedtime. - Basic metabolic panel - Magnesium  3. Joint pain Daughter recently diagnosed with RA. Will check RA factor given his report of joint pains. No deformity of joints observed. - Rheumatoid factor

## 2014-02-02 NOTE — Progress Notes (Signed)
Pre visit review using our clinic review tool, if applicable. No additional management support is needed unless otherwise documented below in the visit note. 

## 2014-02-03 LAB — RHEUMATOID FACTOR: Rhuematoid fact SerPl-aCnc: <10 IU/mL (ref <=14)

## 2014-02-05 ENCOUNTER — Encounter: Payer: Self-pay | Admitting: Adult Health

## 2014-10-23 ENCOUNTER — Encounter: Payer: Self-pay | Admitting: Internal Medicine

## 2014-10-23 NOTE — Telephone Encounter (Signed)
Received mychart msg from pt requesting appt on 4/18 for bometrics screening. Pt was offered the next available appt on 5/9. Pt refused this date and request to be scheduled at another office for the biometrics screening. Please advise.msn

## 2014-10-29 ENCOUNTER — Encounter: Payer: Self-pay | Admitting: Nurse Practitioner

## 2014-10-29 ENCOUNTER — Ambulatory Visit (INDEPENDENT_AMBULATORY_CARE_PROVIDER_SITE_OTHER): Payer: Managed Care, Other (non HMO) | Admitting: Nurse Practitioner

## 2014-10-29 VITALS — BP 148/100 | HR 68 | Temp 98.5°F | Resp 12 | Ht 72.0 in | Wt 203.0 lb

## 2014-10-29 DIAGNOSIS — Z13220 Encounter for screening for lipoid disorders: Secondary | ICD-10-CM | POA: Diagnosis not present

## 2014-10-29 DIAGNOSIS — Z131 Encounter for screening for diabetes mellitus: Secondary | ICD-10-CM | POA: Diagnosis not present

## 2014-10-29 DIAGNOSIS — Z130 Encounter for screening for diseases of the blood and blood-forming organs and certain disorders involving the immune mechanism: Secondary | ICD-10-CM

## 2014-10-29 DIAGNOSIS — Z026 Encounter for examination for insurance purposes: Secondary | ICD-10-CM

## 2014-10-29 DIAGNOSIS — I10 Essential (primary) hypertension: Secondary | ICD-10-CM

## 2014-10-29 LAB — CBC WITH DIFFERENTIAL/PLATELET
Basophils Absolute: 0 10*3/uL (ref 0.0–0.1)
Basophils Relative: 0.5 % (ref 0.0–3.0)
Eosinophils Absolute: 0.5 10*3/uL (ref 0.0–0.7)
Eosinophils Relative: 8.9 % — ABNORMAL HIGH (ref 0.0–5.0)
HCT: 44.4 % (ref 39.0–52.0)
Hemoglobin: 14.9 g/dL (ref 13.0–17.0)
Lymphocytes Relative: 28.4 % (ref 12.0–46.0)
Lymphs Abs: 1.4 10*3/uL (ref 0.7–4.0)
MCHC: 33.6 g/dL (ref 30.0–36.0)
MCV: 89 fl (ref 78.0–100.0)
Monocytes Absolute: 0.4 10*3/uL (ref 0.1–1.0)
Monocytes Relative: 7.7 % (ref 3.0–12.0)
Neutro Abs: 2.8 10*3/uL (ref 1.4–7.7)
Neutrophils Relative %: 54.5 % (ref 43.0–77.0)
Platelets: 215 10*3/uL (ref 150.0–400.0)
RBC: 4.99 Mil/uL (ref 4.22–5.81)
RDW: 13.7 % (ref 11.5–15.5)
WBC: 5.1 10*3/uL (ref 4.0–10.5)

## 2014-10-29 LAB — LIPID PANEL
Cholesterol: 179 mg/dL (ref 0–200)
HDL: 60.3 mg/dL (ref >39.00)
LDL Cholesterol: 110 mg/dL — ABNORMAL HIGH (ref 0–99)
NonHDL: 118.7
Total CHOL/HDL Ratio: 3
Triglycerides: 42 mg/dL (ref 0.0–149.0)
VLDL: 8.4 mg/dL (ref 0.0–40.0)

## 2014-10-29 LAB — BASIC METABOLIC PANEL
BUN: 17 mg/dL (ref 6–23)
CO2: 31 mEq/L (ref 19–32)
Calcium: 9.7 mg/dL (ref 8.4–10.5)
Chloride: 104 mEq/L (ref 96–112)
Creatinine, Ser: 1 mg/dL (ref 0.40–1.50)
GFR: 83.3 mL/min (ref >60.00)
Glucose, Bld: 106 mg/dL — ABNORMAL HIGH (ref 70–99)
Potassium: 5.4 mEq/L — ABNORMAL HIGH (ref 3.5–5.1)
Sodium: 139 mEq/L (ref 135–145)

## 2014-10-29 LAB — HEMOGLOBIN A1C: Hgb A1c MFr Bld: 5.6 % (ref 4.6–6.5)

## 2014-10-29 NOTE — Patient Instructions (Signed)
We will send your form by fax.

## 2014-10-29 NOTE — Assessment & Plan Note (Addendum)
Pt refuses to start medication. He would like to control with diet and exercise. He understands the risks of increased chance of stroke/heart attack. He reports he is not ready for diet/exercise to lose weight at this time.

## 2014-10-29 NOTE — Assessment & Plan Note (Signed)
Filled out most of biometrics screening sheet and will await lab results to fill out and send by fax.

## 2014-10-29 NOTE — Progress Notes (Signed)
   Subjective:    Patient ID: Tony Scott, male    DOB: 10-Feb-1961, 54 y.o.   MRN: [id number]  HPI  Mr. Kalas is a 54 yo male needing his biometrics screening.   1) For insurance purposes through [employer] for biometrics screening  No concerns or questions.  Patient does not want to be on any BP meds.  Waist- 35.5"  Review of Systems  Constitutional: Negative for fever, chills, diaphoresis and fatigue.  Eyes: Negative for visual disturbance.  Respiratory: Negative for chest tightness, shortness of breath and wheezing.   Cardiovascular: Negative for chest pain, palpitations and leg swelling.  Gastrointestinal: Negative for nausea, vomiting, diarrhea and constipation.  Genitourinary: Negative for difficulty urinating.  Skin: Negative for rash.  Neurological: Negative for dizziness, weakness and numbness.  Psychiatric/Behavioral: The patient is not nervous/anxious.       Objective:   Physical Exam  Constitutional: He is oriented to person, place, and time. He appears well-developed and well-nourished. No distress.  BP 148/100 mmHg  Pulse 68  Temp(Src) 98.5 F (36.9 C) (Oral)  Resp 12  Ht 6' (1.829 m)  Wt 203 lb (92.08 kg)  BMI 27.53 kg/m2  SpO2 98%   HENT:  Head: Normocephalic and atraumatic.  Right Ear: External ear normal.  Left Ear: External ear normal.  Mouth/Throat: No oropharyngeal exudate.  Eyes: EOM are normal. Pupils are equal, round, and reactive to light. Right eye exhibits no discharge. Left eye exhibits no discharge. No scleral icterus.  Cardiovascular: Normal rate and regular rhythm.  Exam reveals no gallop and no friction rub.   No murmur heard. Pulmonary/Chest: Effort normal and breath sounds normal. No respiratory distress. He has no wheezes. He has no rales. He exhibits no tenderness.  Neurological: He is alert and oriented to person, place, and time.  Skin: Skin is warm and dry. No rash noted. He is not diaphoretic.  Psychiatric: He has a normal mood  and affect. His behavior is normal. Judgment and thought content normal.       Assessment & Plan:

## 2014-10-29 NOTE — Progress Notes (Signed)
Pre visit review using our clinic review tool, if applicable. No additional management support is needed unless otherwise documented below in the visit note. 

## 2014-11-19 ENCOUNTER — Ambulatory Visit: Payer: Managed Care, Other (non HMO) | Admitting: Internal Medicine

## 2015-03-08 ENCOUNTER — Encounter: Payer: Self-pay | Admitting: Internal Medicine

## 2015-03-08 ENCOUNTER — Ambulatory Visit (INDEPENDENT_AMBULATORY_CARE_PROVIDER_SITE_OTHER): Payer: Managed Care, Other (non HMO) | Admitting: Internal Medicine

## 2015-03-08 VITALS — BP 163/99 | HR 69 | Temp 98.0°F | Ht 72.0 in | Wt 206.4 lb

## 2015-03-08 DIAGNOSIS — I10 Essential (primary) hypertension: Secondary | ICD-10-CM | POA: Diagnosis not present

## 2015-03-08 DIAGNOSIS — J309 Allergic rhinitis, unspecified: Secondary | ICD-10-CM | POA: Diagnosis not present

## 2015-03-08 LAB — COMPREHENSIVE METABOLIC PANEL
ALT: 31 U/L (ref 0–53)
AST: 21 U/L (ref 0–37)
Albumin: 4.5 g/dL (ref 3.5–5.2)
Alkaline Phosphatase: 48 U/L (ref 39–117)
BUN: 16 mg/dL (ref 6–23)
CO2: 32 mEq/L (ref 19–32)
Calcium: 9.8 mg/dL (ref 8.4–10.5)
Chloride: 102 mEq/L (ref 96–112)
Creatinine, Ser: 0.9 mg/dL (ref 0.40–1.50)
GFR: 93.95 mL/min (ref >60.00)
Glucose, Bld: 107 mg/dL — ABNORMAL HIGH (ref 70–99)
Potassium: 5.6 mEq/L — ABNORMAL HIGH (ref 3.5–5.1)
Sodium: 139 mEq/L (ref 135–145)
Total Bilirubin: 0.8 mg/dL (ref 0.2–1.2)
Total Protein: 7.1 g/dL (ref 6.0–8.3)

## 2015-03-08 MED ORDER — AMLODIPINE BESYLATE 5 MG PO TABS: 5.0000 mg | ORAL_TABLET | Freq: Every day | ORAL | Status: DC

## 2015-03-08 MED ORDER — LEVOCETIRIZINE DIHYDROCHLORIDE 5 MG PO TABS: 5.0000 mg | ORAL_TABLET | Freq: Every evening | ORAL | Status: DC

## 2015-03-08 NOTE — Assessment & Plan Note (Signed)
Encouraged him to stop Sudafed. Recommended nasal steroid. He declines. Will try adding Xyzal at bedtime.

## 2015-03-08 NOTE — Progress Notes (Signed)
Pre visit review using our clinic review tool, if applicable. No additional management support is needed unless otherwise documented below in the visit note. 

## 2015-03-08 NOTE — Patient Instructions (Addendum)
Start Amlodipine 5mg  daily to help control blood pressure.  Start Xyzal 5mg  at bedtime to help with allergies.

## 2015-03-08 NOTE — Progress Notes (Signed)
Subjective:    Patient ID: Tony Scott, male    DOB: [birth year], 54 y.o.   MRN: [id number]  HPI  54YO male presents for follow up. Last seen by me in 02/2013.  HTN - No CP, dyspnea. BP elevated at dentist 190s/100s.  Taking Sudafed for allergy symptoms. Taking Claritin D. Allergy testing in past.  BP Readings from Last 3 Encounters:  03/08/15 163/99  10/29/14 148/100  02/02/14 143/89       Past Medical History  Diagnosis Date  . Blood in stool   . Allergy     Hay fever  . Colon polyp   . Back pain, chronic     abnormality L4-5, per pt  . Anal fissure   . Arthritis   . Kidney stones    Family History  Problem Relation Age of Onset  . Colon cancer Mother   . Heart disease Mother   . Hypertension Mother   . Hyperlipidemia Father   . Heart disease Father   . Hypertension Father   . Diabetes Father   . Arthritis Maternal Grandmother   . Hyperlipidemia Maternal Grandmother   . Hypertension Maternal Grandmother   . Rheum arthritis Sister   . Heart disease Paternal Grandfather    Past Surgical History  Procedure Laterality Date  . Nose surgery     Social History   Social History  . Marital Status: Divorced    Spouse Name: N/A  . Number of Children: 2  . Years of Education: N/A   Occupational History  . Sales promotion account executive    Social History Main Topics  . Smoking status: Never Smoker   . Smokeless tobacco: Never Used  . Alcohol Use: 4.0 oz/week    8 drink(s) per week     Comment: social  . Drug Use: No  . Sexual Activity: Not Asked   Other Topics Concern  . None   Social History Narrative   Lives in [place]. Divorced. 2 children in [place].   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  Eyes: Negative for visual disturbance.  Respiratory: Negative for cough  and shortness of breath.   Cardiovascular: Negative for chest pain, palpitations and leg swelling.  Gastrointestinal: Negative for abdominal pain, diarrhea, constipation and abdominal distention.  Genitourinary: Negative for dysuria, urgency and difficulty urinating.  Musculoskeletal: Negative for arthralgias and gait problem.  Skin: Negative for color change and rash.  Hematological: Negative for adenopathy.  Psychiatric/Behavioral: Negative for sleep disturbance and dysphoric mood. The patient is not nervous/anxious.        Objective:    BP 163/99 mmHg  Pulse 69  Temp(Src) 98 F (36.7 C) (Oral)  Ht 6' (1.829 m)  Wt 206 lb 6 oz (93.611 kg)  BMI 27.98 kg/m2  SpO2 98% Physical Exam  Constitutional: He is oriented to person, place, and time. He appears well-developed and well-nourished. No distress.  HENT:  Head: Normocephalic and atraumatic.  Right Ear: External ear normal.  Left Ear: External ear normal.  Nose: Nose normal.  Mouth/Throat: Oropharynx is clear and moist. No oropharyngeal exudate.  Eyes: Conjunctivae and EOM are normal. Pupils are equal, round, and reactive to light. Right eye exhibits no discharge. Left eye exhibits no discharge. No scleral icterus.  Neck: Normal range  of motion. Neck supple. No tracheal deviation present. No thyromegaly present.  Cardiovascular: Normal rate, regular rhythm and normal heart sounds.  Exam reveals no gallop and no friction rub.   No murmur heard. Pulmonary/Chest: Effort normal and breath sounds normal. No accessory muscle usage. No tachypnea. No respiratory distress. He has no decreased breath sounds. He has no wheezes. He has no rhonchi. He has no rales. He exhibits no tenderness.  Musculoskeletal: Normal range of motion. He exhibits no edema.  Lymphadenopathy:    He has no cervical adenopathy.  Neurological: He is alert and oriented to person, place, and time. No cranial nerve deficit. Coordination normal.  Skin: Skin is warm and  dry. No rash noted. He is not diaphoretic. No erythema. No pallor.  Psychiatric: He has a normal mood and affect. His behavior is normal. Judgment and thought content normal.          Assessment & Plan:   Problem List Items Addressed This Visit      Unprioritized   Allergic rhinitis    Encouraged him to stop Sudafed. Recommended nasal steroid. He declines. Will try adding Xyzal at bedtime.      HTN (hypertension) - Primary    BP Readings from Last 3 Encounters:  03/08/15 163/99  10/29/14 148/100  02/02/14 143/89   BP elevated. Encouraged him to stop Sudafed. He declines. Will start Amlodipine 5mg  daily. Recheck BP in 2 weeks. EKG today was normal.      Relevant Medications   amLODipine (NORVASC) 5 MG tablet   Other Relevant Orders   EKG 12-Lead (Completed)   Comprehensive metabolic panel       Return in about 2 weeks (around 03/22/2015) for Recheck of Blood Pressure.

## 2015-03-08 NOTE — Assessment & Plan Note (Addendum)
BP Readings from Last 3 Encounters:  03/08/15 163/99  10/29/14 148/100  02/02/14 143/89   BP elevated. Encouraged him to stop Sudafed. He declines. Will start Amlodipine 5mg  daily. Recheck BP in 2 weeks. EKG today was normal.

## 2015-03-12 ENCOUNTER — Other Ambulatory Visit (INDEPENDENT_AMBULATORY_CARE_PROVIDER_SITE_OTHER): Payer: Managed Care, Other (non HMO)

## 2015-03-12 ENCOUNTER — Telehealth: Payer: Self-pay | Admitting: *Deleted

## 2015-03-12 DIAGNOSIS — E875 Hyperkalemia: Secondary | ICD-10-CM | POA: Diagnosis not present

## 2015-03-12 NOTE — Telephone Encounter (Signed)
BMP for hyperkalemia 

## 2015-03-12 NOTE — Telephone Encounter (Signed)
Labs and dx?  

## 2015-03-13 ENCOUNTER — Ambulatory Visit: Payer: Self-pay | Admitting: Internal Medicine

## 2015-03-13 LAB — BASIC METABOLIC PANEL
BUN: 24 mg/dL — ABNORMAL HIGH (ref 6–23)
CO2: 29 mEq/L (ref 19–32)
Calcium: 9.7 mg/dL (ref 8.4–10.5)
Chloride: 103 mEq/L (ref 96–112)
Creatinine, Ser: 1.11 mg/dL (ref 0.40–1.50)
GFR: 73.75 mL/min (ref >60.00)
Glucose, Bld: 90 mg/dL (ref 70–99)
Potassium: 4.7 mEq/L (ref 3.5–5.1)
Sodium: 138 mEq/L (ref 135–145)

## 2015-03-22 ENCOUNTER — Ambulatory Visit (INDEPENDENT_AMBULATORY_CARE_PROVIDER_SITE_OTHER): Payer: Managed Care, Other (non HMO)

## 2015-03-22 VITALS — BP 160/110 | HR 65

## 2015-03-22 DIAGNOSIS — I10 Essential (primary) hypertension: Secondary | ICD-10-CM

## 2015-03-22 NOTE — Progress Notes (Signed)
Pt was giving a BP recheck.

## 2015-03-26 NOTE — Progress Notes (Signed)
Discussed with nurse at time of visit. Will increase Amlodipine to 10mg  daily. Recheck in visit next week. Pt strongly encouraged to stop Sudafed.

## 2015-03-27 ENCOUNTER — Other Ambulatory Visit: Payer: Self-pay

## 2015-03-27 DIAGNOSIS — I10 Essential (primary) hypertension: Secondary | ICD-10-CM

## 2015-03-27 MED ORDER — AMLODIPINE BESYLATE 5 MG PO TABS: 5.0000 mg | ORAL_TABLET | Freq: Every day | ORAL | Status: DC

## 2015-03-27 MED ORDER — LEVOCETIRIZINE DIHYDROCHLORIDE 5 MG PO TABS: 5.0000 mg | ORAL_TABLET | Freq: Every evening | ORAL | Status: DC

## 2015-04-05 ENCOUNTER — Encounter: Payer: Self-pay | Admitting: Internal Medicine

## 2015-04-05 ENCOUNTER — Ambulatory Visit (INDEPENDENT_AMBULATORY_CARE_PROVIDER_SITE_OTHER): Payer: Managed Care, Other (non HMO) | Admitting: Internal Medicine

## 2015-04-05 VITALS — BP 145/98 | HR 68 | Temp 98.3°F | Ht 72.0 in | Wt 207.0 lb

## 2015-04-05 DIAGNOSIS — J309 Allergic rhinitis, unspecified: Secondary | ICD-10-CM

## 2015-04-05 DIAGNOSIS — I10 Essential (primary) hypertension: Secondary | ICD-10-CM

## 2015-04-05 MED ORDER — HYDROCHLOROTHIAZIDE 12.5 MG PO CAPS: 12.5000 mg | ORAL_CAPSULE | Freq: Every day | ORAL | Status: DC

## 2015-04-05 MED ORDER — AMLODIPINE BESYLATE 10 MG PO TABS: 10.0000 mg | ORAL_TABLET | Freq: Every day | ORAL | Status: DC

## 2015-04-05 MED ORDER — LEVOCETIRIZINE DIHYDROCHLORIDE 5 MG PO TABS: 5.0000 mg | ORAL_TABLET | Freq: Every evening | ORAL | Status: DC

## 2015-04-05 NOTE — Assessment & Plan Note (Signed)
BP Readings from Last 3 Encounters:  04/05/15 145/98  03/22/15 160/110  03/08/15 163/99   BP improved but not at goal. Will add HCTC 12.5mg  daily. Follow up in 3 months and prn.

## 2015-04-05 NOTE — Progress Notes (Signed)
Pre visit review using our clinic review tool, if applicable. No additional management support is needed unless otherwise documented below in the visit note. 

## 2015-04-05 NOTE — Assessment & Plan Note (Signed)
Symptoms improved with Xyzal. Will continue.

## 2015-04-05 NOTE — Patient Instructions (Signed)
Start HCTZ 12.5mg  daily  Continue Amlodipine 10mg  daily.  Follow up in 3 months and as needed.

## 2015-04-05 NOTE — Progress Notes (Signed)
Subjective:    Patient ID: Tony Scott, male    DOB: [birth year], 54 y.o.   MRN: [id number]  HPI  54YO male presents for follow up.  Feeling well. No problems with medication. Compliant with medication. No CP, HA, palpitations.  Allergy symptoms improved with Xyzal.  Wt Readings from Last 3 Encounters:  04/05/15 207 lb (93.895 kg)  03/08/15 206 lb 6 oz (93.611 kg)  10/29/14 203 lb (92.08 kg)   BP Readings from Last 3 Encounters:  04/05/15 145/98  03/22/15 160/110  03/08/15 163/99     Past Medical History  Diagnosis Date  . Blood in stool   . Allergy     Hay fever  . Colon polyp   . Back pain, chronic     abnormality L4-5, per pt  . Anal fissure   . Arthritis   . Kidney stones    Family History  Problem Relation Age of Onset  . Colon cancer Mother   . Heart disease Mother   . Hypertension Mother   . Hyperlipidemia Father   . Heart disease Father   . Hypertension Father   . Diabetes Father   . Arthritis Maternal Grandmother   . Hyperlipidemia Maternal Grandmother   . Hypertension Maternal Grandmother   . Rheum arthritis Sister   . Heart disease Paternal Grandfather    Past Surgical History  Procedure Laterality Date  . Nose surgery     Social History   Social History  . Marital Status: Divorced    Spouse Name: N/A  . Number of Children: 2  . Years of Education: N/A   Occupational History  . Sales promotion account executive    Social History Main Topics  . Smoking status: Never Smoker   . Smokeless tobacco: Never Used  . Alcohol Use: 4.0 oz/week    8 drink(s) per week     Comment: social  . Drug Use: No  . Sexual Activity: Not Asked   Other Topics Concern  . None   Social History Narrative   Lives in [place]. Divorced. 2 children in [place].   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  Eyes: Negative for visual disturbance.  Respiratory: Negative for cough and shortness of breath.   Cardiovascular: Negative for chest pain, palpitations and leg swelling.  Gastrointestinal: Negative for abdominal pain and abdominal distention.  Genitourinary: Negative for dysuria, urgency and difficulty urinating.  Musculoskeletal: Negative for arthralgias and gait problem.  Skin: Negative for color change and rash.  Neurological: Negative for headaches.  Hematological: Negative for adenopathy.  Psychiatric/Behavioral: Negative for sleep disturbance and dysphoric mood. The patient is not nervous/anxious.        Objective:    BP 145/98 mmHg  Pulse 68  Temp(Src) 98.3 F (36.8 C) (Oral)  Ht 6' (1.829 m)  Wt 207 lb (93.895 kg)  BMI 28.07 kg/m2  SpO2 99% Physical Exam  Constitutional: He is oriented to person, place, and time. He appears well-developed and well-nourished. No distress.  HENT:  Head: Normocephalic and atraumatic.  Right Ear: External ear normal.  Left Ear: External ear normal.  Nose: Nose normal.  Mouth/Throat: Oropharynx is clear and moist. No oropharyngeal exudate.  Eyes: Conjunctivae and EOM are normal. Pupils are equal, round, and reactive to light.  Right eye exhibits no discharge. Left eye exhibits no discharge. No scleral icterus.  Neck: Normal range of motion. Neck supple. No tracheal deviation present. No thyromegaly present.  Cardiovascular: Normal rate, regular rhythm and normal heart sounds.  Exam reveals no gallop and no friction rub.   No murmur heard. Pulmonary/Chest: Effort normal and breath sounds normal. No accessory muscle usage. No tachypnea. No respiratory distress. He has no decreased breath sounds. He has no wheezes. He has no rhonchi. He has no rales. He exhibits no tenderness.  Musculoskeletal: Normal range of motion. He exhibits no edema.  Lymphadenopathy:    He has no cervical adenopathy.  Neurological: He is alert  and oriented to person, place, and time. No cranial nerve deficit. Coordination normal.  Skin: Skin is warm and dry. No rash noted. He is not diaphoretic. No erythema. No pallor.  Psychiatric: He has a normal mood and affect. His behavior is normal. Judgment and thought content normal.          Assessment & Plan:   Problem List Items Addressed This Visit      Unprioritized   Allergic rhinitis    Symptoms improved with Xyzal. Will continue.      HTN (hypertension) - Primary    BP Readings from Last 3 Encounters:  04/05/15 145/98  03/22/15 160/110  03/08/15 163/99   BP improved but not at goal. Will add HCTC 12.5mg  daily. Follow up in 3 months and prn.      Relevant Medications   amLODipine (NORVASC) 10 MG tablet   hydrochlorothiazide (MICROZIDE) 12.5 MG capsule       Return in about 3 months (around 07/05/2015) for Recheck of Blood Pressure.

## 2015-07-05 ENCOUNTER — Encounter: Payer: Self-pay | Admitting: Internal Medicine

## 2015-07-05 ENCOUNTER — Ambulatory Visit (INDEPENDENT_AMBULATORY_CARE_PROVIDER_SITE_OTHER): Payer: Managed Care, Other (non HMO) | Admitting: Internal Medicine

## 2015-07-05 ENCOUNTER — Ambulatory Visit: Payer: Managed Care, Other (non HMO) | Admitting: Internal Medicine

## 2015-07-05 VITALS — BP 120/80 | HR 75 | Temp 97.9°F | Ht 72.0 in | Wt 215.4 lb

## 2015-07-05 DIAGNOSIS — I10 Essential (primary) hypertension: Secondary | ICD-10-CM

## 2015-07-05 NOTE — Progress Notes (Signed)
Pre-visit discussion using our clinic review tool. No additional management support is needed unless otherwise documented below in the visit note.  

## 2015-07-05 NOTE — Progress Notes (Signed)
Subjective:    Patient ID: Tony Scott, male    DOB: 02-Nov-1960, 54 y.o.   MRN: [id number]  HPI  54YO male presents for follow up.  HTN - Compliant with medication. No HA, CP.  No new concerns today.    Wt Readings from Last 3 Encounters:  07/05/15 215 lb 6 oz (97.693 kg)  04/05/15 207 lb (93.895 kg)  03/08/15 206 lb 6 oz (93.611 kg)   BP Readings from Last 3 Encounters:  07/05/15 120/80  04/05/15 145/98  03/22/15 160/110    Past Medical History  Diagnosis Date  . Blood in stool   . Allergy     Hay fever  . Colon polyp   . Back pain, chronic     abnormality L4-5, per pt  . Anal fissure   . Arthritis   . Kidney stones    Family History  Problem Relation Age of Onset  . Colon cancer Mother   . Heart disease Mother   . Hypertension Mother   . Hyperlipidemia Father   . Heart disease Father   . Hypertension Father   . Diabetes Father   . Arthritis Maternal Grandmother   . Hyperlipidemia Maternal Grandmother   . Hypertension Maternal Grandmother   . Rheum arthritis Sister   . Heart disease Paternal Grandfather    Past Surgical History  Procedure Laterality Date  . Nose surgery     Social History   Social History  . Marital Status: Divorced    Spouse Name: N/A  . Number of Children: 2  . Years of Education: N/A   Occupational History  . Sales promotion account executive    Social History Main Topics  . Smoking status: Never Smoker   . Smokeless tobacco: Never Used  . Alcohol Use: 4.0 oz/week    8 drink(s) per week     Comment: social  . Drug Use: No  . Sexual Activity: Not Asked   Other Topics Concern  . None   Social History Narrative   Lives in [place]. Divorced. 2 children in [place].   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  Eyes: Negative for visual  disturbance.  Respiratory: Negative for cough and shortness of breath.   Cardiovascular: Negative for chest pain, palpitations and leg swelling.  Gastrointestinal: Negative for abdominal pain and abdominal distention.  Genitourinary: Negative for dysuria, urgency and difficulty urinating.  Musculoskeletal: Negative for arthralgias and gait problem.  Skin: Negative for color change and rash.  Hematological: Negative for adenopathy.  Psychiatric/Behavioral: Negative for sleep disturbance and dysphoric mood. The patient is not nervous/anxious.        Objective:    BP 120/80 mmHg  Pulse 75  Temp(Src) 97.9 F (36.6 C) (Oral)  Ht 6' (1.829 m)  Wt 215 lb 6 oz (97.693 kg)  BMI 29.20 kg/m2  SpO2 97% Physical Exam  Constitutional: He is oriented to person, place, and time. He appears well-developed and well-nourished. No distress.  HENT:  Head: Normocephalic and atraumatic.  Right Ear: External ear normal.  Left Ear: External ear normal.  Nose: Nose normal.  Mouth/Throat: Oropharynx is clear and moist. No oropharyngeal exudate.  Eyes: Conjunctivae and EOM are normal. Pupils are equal, round, and reactive to light. Right eye exhibits no discharge. Left  eye exhibits no discharge. No scleral icterus.  Neck: Normal range of motion. Neck supple. No tracheal deviation present. No thyromegaly present.  Cardiovascular: Normal rate, regular rhythm and normal heart sounds.  Exam reveals no gallop and no friction rub.   No murmur heard. Pulmonary/Chest: Effort normal and breath sounds normal. No accessory muscle usage. No tachypnea. No respiratory distress. He has no decreased breath sounds. He has no wheezes. He has no rhonchi. He has no rales. He exhibits no tenderness.  Musculoskeletal: Normal range of motion. He exhibits no edema.  Lymphadenopathy:    He has no cervical adenopathy.  Neurological: He is alert and oriented to person, place, and time. No cranial nerve deficit. Coordination normal.    Skin: Skin is warm and dry. No rash noted. He is not diaphoretic. No erythema. No pallor.  Psychiatric: He has a normal mood and affect. His behavior is normal. Judgment and thought content normal.          Assessment & Plan:   Problem List Items Addressed This Visit      Unprioritized   HTN (hypertension) - Primary    BP Readings from Last 3 Encounters:  07/05/15 120/80  04/05/15 145/98  03/22/15 160/110   BP well controlled. Continue current medications. Follow up 6 months.          Return in about 6 months (around 01/03/2016) for Recheck.

## 2015-07-05 NOTE — Assessment & Plan Note (Signed)
BP Readings from Last 3 Encounters:  07/05/15 120/80  04/05/15 145/98  03/22/15 160/110   BP well controlled. Continue current medications. Follow up 6 months.

## 2015-07-05 NOTE — Patient Instructions (Signed)
Continue current medications.  Follow up in 6 months and sooner as needed. 

## 2015-11-19 ENCOUNTER — Encounter: Payer: Self-pay | Admitting: Internal Medicine

## 2015-11-19 NOTE — Telephone Encounter (Signed)
Would you want to see patient?

## 2015-11-20 ENCOUNTER — Ambulatory Visit: Payer: Managed Care, Other (non HMO) | Admitting: Internal Medicine

## 2015-11-21 NOTE — Telephone Encounter (Signed)
Lm on vm to call back an schedule an appt

## 2015-11-22 ENCOUNTER — Other Ambulatory Visit: Payer: Self-pay | Admitting: Family Medicine

## 2015-11-22 ENCOUNTER — Ambulatory Visit (INDEPENDENT_AMBULATORY_CARE_PROVIDER_SITE_OTHER): Payer: Managed Care, Other (non HMO) | Admitting: Family Medicine

## 2015-11-22 ENCOUNTER — Encounter: Payer: Self-pay | Admitting: Family Medicine

## 2015-11-22 VITALS — BP 130/88 | HR 77 | Temp 98.5°F | Ht 72.0 in | Wt 207.6 lb

## 2015-11-22 DIAGNOSIS — T148 Other injury of unspecified body region: Secondary | ICD-10-CM

## 2015-11-22 DIAGNOSIS — W57XXXA Bitten or stung by nonvenomous insect and other nonvenomous arthropods, initial encounter: Secondary | ICD-10-CM

## 2015-11-22 DIAGNOSIS — A6920 Lyme disease, unspecified: Secondary | ICD-10-CM | POA: Diagnosis not present

## 2015-11-22 MED ORDER — DOXYCYCLINE HYCLATE 100 MG PO TABS: 100.0000 mg | ORAL_TABLET | Freq: Two times a day (BID) | ORAL | Status: DC

## 2015-11-22 NOTE — Patient Instructions (Signed)
Nice to meet you. Your rash is concerning for Lyme disease following your tick bite. We will treat you with doxycycline. We will collect Lyme titers. If you develop nausea, vomiting, fevers, spreading redness, body aches, change in rash, or any new or changing symptoms please seek medical attention.

## 2015-11-22 NOTE — Assessment & Plan Note (Signed)
Patient with recent tick bite. Rash concerning for erythema migrans. Given appearance of rash we will send off Lyme titers. We will additionally proceed with treatment for Lyme disease given characteristic rash. Patient with no symptoms of [facility] spotted fever. Rash also does not appear to be cellulitic. Either way doxycycline will treat Lyme disease, cellulitis, and [facility] spotted fever. Doxycycline sent to pharmacy. Patient is given return precautions.

## 2015-11-22 NOTE — Progress Notes (Signed)
Pre visit review using our clinic review tool, if applicable. No additional management support is needed unless otherwise documented below in the visit note. 

## 2015-11-22 NOTE — Progress Notes (Signed)
Patient ID: Tony Scott, male   DOB: [birth year], 55 y.o.   MRN: [id number]  [name], MD Phone: [phone]  Tony Scott is a 55 y.o. male who presents today for same-day visit.  Patient notes 2 weeks ago was bit by a tick on his right calf. Notes it may have been there for about a week though was not engorged. Notes the area was red and irritated initially though the redness became slightly larger and itched. He has not had any fevers. He has not had any nausea or vomiting. He feels well overall. No spreading redness. Does note the rash appeared to be a bull's-eye. Tried hydrocortisone cream on it and it got slightly better.  PMH: nonsmoker.   ROS see history of present illness  Objective  Physical Exam Filed Vitals:   11/22/15 1318  BP: 130/88  Pulse: 77  Temp: 98.5 F (36.9 C)    BP Readings from Last 3 Encounters:  11/22/15 130/88  07/05/15 120/80  04/05/15 145/98   Wt Readings from Last 3 Encounters:  11/22/15 207 lb 9.6 oz (94.167 kg)  07/05/15 215 lb 6 oz (97.693 kg)  04/05/15 207 lb (93.895 kg)    Physical Exam  Constitutional: He is well-developed, well-nourished, and in no distress.  HENT:  Head: Normocephalic and atraumatic.  Cardiovascular: Normal rate, regular rhythm and normal heart sounds.   Pulmonary/Chest: Effort normal and breath sounds normal.  Neurological: He is alert. Gait normal.  Skin: Skin is warm and dry. He is not diaphoretic.        Assessment/Plan: Please see individual problem list.  Tick bite Patient with recent tick bite. Rash concerning for erythema migrans. Given appearance of rash we will send off Lyme titers. We will additionally proceed with treatment for Lyme disease given characteristic rash. Patient with no symptoms of [facility] spotted fever. Rash also does not appear to be cellulitic. Either way doxycycline will treat Lyme disease, cellulitis, and [facility] spotted fever. Doxycycline sent to pharmacy.  Patient is given return precautions.    Orders Placed This Encounter  Procedures  . B. burgdorfi antibodies    Meds ordered this encounter  Medications  . doxycycline (VIBRA-TABS) 100 MG tablet    Sig: Take 1 tablet (100 mg total) by mouth 2 (two) times daily.    Dispense:  20 tablet    Refill:  0    [name], MD [place]

## 2015-11-25 LAB — LYME AB/WESTERN BLOT REFLEX: B burgdorferi Ab IgG+IgM: 1.77 Index — ABNORMAL HIGH (ref <0.90)

## 2015-11-26 LAB — LYME ABY, WSTRN BLT IGG & IGM W/BANDS

## 2016-01-06 ENCOUNTER — Ambulatory Visit (INDEPENDENT_AMBULATORY_CARE_PROVIDER_SITE_OTHER): Payer: Managed Care, Other (non HMO) | Admitting: Internal Medicine

## 2016-01-06 ENCOUNTER — Encounter: Payer: Self-pay | Admitting: Internal Medicine

## 2016-01-06 VITALS — BP 128/80 | HR 74 | Ht 72.0 in | Wt 211.4 lb

## 2016-01-06 DIAGNOSIS — Z86010 Personal history of colonic polyps: Secondary | ICD-10-CM | POA: Insufficient documentation

## 2016-01-06 DIAGNOSIS — I10 Essential (primary) hypertension: Secondary | ICD-10-CM

## 2016-01-06 LAB — COMPREHENSIVE METABOLIC PANEL
ALT: 19 U/L (ref 0–53)
AST: 19 U/L (ref 0–37)
Albumin: 4.5 g/dL (ref 3.5–5.2)
Alkaline Phosphatase: 40 U/L (ref 39–117)
BUN: 19 mg/dL (ref 6–23)
CO2: 32 mEq/L (ref 19–32)
Calcium: 9.9 mg/dL (ref 8.4–10.5)
Chloride: 102 mEq/L (ref 96–112)
Creatinine, Ser: 1 mg/dL (ref 0.40–1.50)
GFR: 82.93 mL/min (ref >60.00)
Glucose, Bld: 100 mg/dL — ABNORMAL HIGH (ref 70–99)
Potassium: 4.2 mEq/L (ref 3.5–5.1)
Sodium: 139 mEq/L (ref 135–145)
Total Bilirubin: 0.4 mg/dL (ref 0.2–1.2)
Total Protein: 7.2 g/dL (ref 6.0–8.3)

## 2016-01-06 LAB — LIPID PANEL
Cholesterol: 219 mg/dL — ABNORMAL HIGH (ref 0–200)
HDL: 60.9 mg/dL (ref >39.00)
LDL Cholesterol: 141 mg/dL — ABNORMAL HIGH (ref 0–99)
NonHDL: 158.52
Total CHOL/HDL Ratio: 4
Triglycerides: 88 mg/dL (ref 0.0–149.0)
VLDL: 17.6 mg/dL (ref 0.0–40.0)

## 2016-01-06 MED ORDER — LEVOCETIRIZINE DIHYDROCHLORIDE 5 MG PO TABS: 5.0000 mg | ORAL_TABLET | Freq: Every evening | ORAL | Status: DC

## 2016-01-06 MED ORDER — AMLODIPINE BESYLATE 10 MG PO TABS: 10.0000 mg | ORAL_TABLET | Freq: Every day | ORAL | Status: DC

## 2016-01-06 MED ORDER — HYDROCHLOROTHIAZIDE 12.5 MG PO CAPS: 12.5000 mg | ORAL_CAPSULE | Freq: Every day | ORAL | Status: DC

## 2016-01-06 NOTE — Progress Notes (Signed)
Subjective:    Patient ID: Tony Scott, male    DOB: 06-Apr-1961, 55 y.o.   MRN: [id number]  HPI  55YO male presents for follow up.  HTN - Compliant with medication. No CP, HA. Feeling well.  No new concerns today.   Wt Readings from Last 3 Encounters:  01/06/16 211 lb 6.4 oz (95.89 kg)  11/22/15 207 lb 9.6 oz (94.167 kg)  07/05/15 215 lb 6 oz (97.693 kg)   BP Readings from Last 3 Encounters:  01/06/16 128/80  11/22/15 130/88  07/05/15 120/80    Past Medical History  Diagnosis Date  . Blood in stool   . Allergy     Hay fever  . Colon polyp   . Back pain, chronic     abnormality L4-5, per pt  . Anal fissure   . Arthritis   . Kidney stones    Family History  Problem Relation Age of Onset  . Colon cancer Mother   . Heart disease Mother   . Hypertension Mother   . Hyperlipidemia Father   . Heart disease Father   . Hypertension Father   . Diabetes Father   . Arthritis Maternal Grandmother   . Hyperlipidemia Maternal Grandmother   . Hypertension Maternal Grandmother   . Rheum arthritis Sister   . Heart disease Paternal Grandfather    Past Surgical History  Procedure Laterality Date  . Nose surgery     Social History   Social History  . Marital Status: Divorced    Spouse Name: N/A  . Number of Children: 2  . Years of Education: N/A   Occupational History  . Sales promotion account executive    Social History Main Topics  . Smoking status: Never Smoker   . Smokeless tobacco: Never Used  . Alcohol Use: 4.0 oz/week    8 drink(s) per week     Comment: social  . Drug Use: No  . Sexual Activity: Not Asked   Other Topics Concern  . None   Social History Narrative   Lives in [place]. Divorced. 2 children in [place].   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  Eyes:  Negative for visual disturbance.  Respiratory: Negative for cough and shortness of breath.   Cardiovascular: Negative for chest pain, palpitations and leg swelling.  Gastrointestinal: Negative for nausea, vomiting, abdominal pain, diarrhea, constipation and abdominal distention.  Genitourinary: Negative for dysuria, urgency and difficulty urinating.  Musculoskeletal: Negative for arthralgias and gait problem.  Skin: Negative for color change and rash.  Hematological: Negative for adenopathy.  Psychiatric/Behavioral: Negative for sleep disturbance and dysphoric mood. The patient is not nervous/anxious.        Objective:    BP 128/80 mmHg  Pulse 74  Ht 6' (1.829 m)  Wt 211 lb 6.4 oz (95.89 kg)  BMI 28.66 kg/m2  SpO2 97% Physical Exam  Constitutional: He is oriented to person, place, and time. He appears well-developed and well-nourished. No distress.  HENT:  Head: Normocephalic and atraumatic.  Right Ear: External ear normal.  Left Ear: External ear normal.  Nose: Nose normal.  Mouth/Throat: Oropharynx is clear and moist. No oropharyngeal exudate.  Eyes: Conjunctivae and EOM are normal. Pupils are equal, round, and reactive to light. Right eye exhibits no discharge. Left  eye exhibits no discharge. No scleral icterus.  Neck: Normal range of motion. Neck supple. No tracheal deviation present. No thyromegaly present.  Cardiovascular: Normal rate, regular rhythm and normal heart sounds.  Exam reveals no gallop and no friction rub.   No murmur heard. Pulmonary/Chest: Effort normal and breath sounds normal. No accessory muscle usage. No tachypnea. No respiratory distress. He has no decreased breath sounds. He has no wheezes. He has no rhonchi. He has no rales. He exhibits no tenderness.  Musculoskeletal: Normal range of motion. He exhibits no edema.  Lymphadenopathy:    He has no cervical adenopathy.  Neurological: He is alert and oriented to person, place, and time. No cranial nerve  deficit. Coordination normal.  Skin: Skin is warm and dry. No rash noted. He is not diaphoretic. No erythema. No pallor.  Psychiatric: He has a normal mood and affect. His behavior is normal. Judgment and thought content normal.          Assessment & Plan:   Problem List Items Addressed This Visit      Unprioritized   History of colonic polyps    Reviewed colonoscopy. Needs repeat colonoscopy in 03/2017.      HTN (hypertension) - Primary    BP Readings from Last 3 Encounters:  01/06/16 128/80  11/22/15 130/88  07/05/15 120/80   BP well controlled. Renal function with labs today. Continue HCTZ and Amlodipine.      Relevant Medications   amLODipine (NORVASC) 10 MG tablet   hydrochlorothiazide (MICROZIDE) 12.5 MG capsule   Other Relevant Orders   Comprehensive metabolic panel   Lipid panel       Return in about 6 months (around 07/07/2016) for Physical.  Ronette Deter, MD Internal Medicine [facility]

## 2016-01-06 NOTE — Progress Notes (Signed)
Pre visit review using our clinic review tool, if applicable. No additional management support is needed unless otherwise documented below in the visit note. 

## 2016-01-06 NOTE — Assessment & Plan Note (Signed)
BP Readings from Last 3 Encounters:  01/06/16 128/80  11/22/15 130/88  07/05/15 120/80   BP well controlled. Renal function with labs today. Continue HCTZ and Amlodipine.

## 2016-01-06 NOTE — Patient Instructions (Addendum)
Labs today.  Follow up in 6 months. 

## 2016-01-06 NOTE — Assessment & Plan Note (Signed)
Reviewed colonoscopy. Needs repeat colonoscopy in 03/2017.

## 2016-07-15 ENCOUNTER — Encounter: Payer: Self-pay | Admitting: Family Medicine

## 2016-07-22 ENCOUNTER — Other Ambulatory Visit: Payer: Self-pay | Admitting: Family Medicine

## 2016-07-22 MED ORDER — LEVOCETIRIZINE DIHYDROCHLORIDE 5 MG PO TABS: 5.0000 mg | ORAL_TABLET | Freq: Every evening | ORAL | 3 refills | Status: DC

## 2016-07-29 ENCOUNTER — Other Ambulatory Visit: Payer: Self-pay | Admitting: Family Medicine

## 2016-07-29 MED ORDER — LORATADINE 10 MG PO TABS: 10.0000 mg | ORAL_TABLET | Freq: Every day | ORAL | 11 refills | Status: DC

## 2016-10-26 ENCOUNTER — Telehealth: Payer: Self-pay | Admitting: Family

## 2016-10-26 DIAGNOSIS — I10 Essential (primary) hypertension: Secondary | ICD-10-CM

## 2016-10-26 NOTE — Telephone Encounter (Signed)
Please advise 

## 2016-10-26 NOTE — Telephone Encounter (Signed)
Pt would like to have labs done before appt. Need orders please and thank you!  Call pt @ [phone].

## 2016-10-27 NOTE — Telephone Encounter (Signed)
Ordered  Please call and advise fasting lab appt

## 2016-10-27 NOTE — Telephone Encounter (Signed)
Patient has been informed.

## 2016-11-04 ENCOUNTER — Encounter: Payer: Self-pay | Admitting: Family

## 2016-11-04 ENCOUNTER — Ambulatory Visit (INDEPENDENT_AMBULATORY_CARE_PROVIDER_SITE_OTHER): Payer: Managed Care, Other (non HMO) | Admitting: Family

## 2016-11-04 VITALS — BP 135/90 | HR 67 | Temp 98.0°F | Ht 72.0 in | Wt 219.8 lb

## 2016-11-04 DIAGNOSIS — I10 Essential (primary) hypertension: Secondary | ICD-10-CM | POA: Diagnosis not present

## 2016-11-04 DIAGNOSIS — J302 Other seasonal allergic rhinitis: Secondary | ICD-10-CM

## 2016-11-04 LAB — CBC WITH DIFFERENTIAL/PLATELET
Basophils Absolute: 0 10*3/uL (ref 0.0–0.1)
Basophils Relative: 0.4 % (ref 0.0–3.0)
Eosinophils Absolute: 0.2 10*3/uL (ref 0.0–0.7)
Eosinophils Relative: 2.9 % (ref 0.0–5.0)
HCT: 47.7 % (ref 39.0–52.0)
Hemoglobin: 15.9 g/dL (ref 13.0–17.0)
Lymphocytes Relative: 28.2 % (ref 12.0–46.0)
Lymphs Abs: 1.8 10*3/uL (ref 0.7–4.0)
MCHC: 33.4 g/dL (ref 30.0–36.0)
MCV: 90.7 fl (ref 78.0–100.0)
Monocytes Absolute: 0.5 10*3/uL (ref 0.1–1.0)
Monocytes Relative: 7.7 % (ref 3.0–12.0)
Neutro Abs: 3.8 10*3/uL (ref 1.4–7.7)
Neutrophils Relative %: 60.8 % (ref 43.0–77.0)
Platelets: 232 10*3/uL (ref 150.0–400.0)
RBC: 5.26 Mil/uL (ref 4.22–5.81)
RDW: 13.5 % (ref 11.5–15.5)
WBC: 6.3 10*3/uL (ref 4.0–10.5)

## 2016-11-04 LAB — LIPID PANEL
Cholesterol: 249 mg/dL — ABNORMAL HIGH (ref 0–200)
HDL: 64.7 mg/dL (ref >39.00)
LDL Cholesterol: 164 mg/dL — ABNORMAL HIGH (ref 0–99)
NonHDL: 183.88
Total CHOL/HDL Ratio: 4
Triglycerides: 101 mg/dL (ref 0.0–149.0)
VLDL: 20.2 mg/dL (ref 0.0–40.0)

## 2016-11-04 LAB — TSH: TSH: 1.43 u[IU]/mL (ref 0.35–4.50)

## 2016-11-04 LAB — COMPREHENSIVE METABOLIC PANEL
ALT: 28 U/L (ref 0–53)
AST: 18 U/L (ref 0–37)
Albumin: 4.8 g/dL (ref 3.5–5.2)
Alkaline Phosphatase: 45 U/L (ref 39–117)
BUN: 17 mg/dL (ref 6–23)
CO2: 33 mEq/L — ABNORMAL HIGH (ref 19–32)
Calcium: 10.1 mg/dL (ref 8.4–10.5)
Chloride: 100 mEq/L (ref 96–112)
Creatinine, Ser: 0.99 mg/dL (ref 0.40–1.50)
GFR: 83.63 mL/min (ref >60.00)
Glucose, Bld: 95 mg/dL (ref 70–99)
Potassium: 4.1 mEq/L (ref 3.5–5.1)
Sodium: 140 mEq/L (ref 135–145)
Total Bilirubin: 0.9 mg/dL (ref 0.2–1.2)
Total Protein: 7.5 g/dL (ref 6.0–8.3)

## 2016-11-04 LAB — HEMOGLOBIN A1C: Hgb A1c MFr Bld: 5.8 % (ref 4.6–6.5)

## 2016-11-04 LAB — VITAMIN D 25 HYDROXY (VIT D DEFICIENCY, FRACTURES): VITD: 18.98 ng/mL — ABNORMAL LOW (ref 30.00–100.00)

## 2016-11-04 LAB — PSA: PSA: 2.68 ng/mL (ref 0.10–4.00)

## 2016-11-04 MED ORDER — AMLODIPINE BESYLATE 10 MG PO TABS: 10.0000 mg | ORAL_TABLET | Freq: Every day | ORAL | 3 refills | Status: DC

## 2016-11-04 MED ORDER — HYDROCHLOROTHIAZIDE 12.5 MG PO CAPS: 12.5000 mg | ORAL_CAPSULE | Freq: Every day | ORAL | 3 refills | Status: DC

## 2016-11-04 MED ORDER — LISINOPRIL 5 MG PO TABS: 5.0000 mg | ORAL_TABLET | Freq: Every day | ORAL | 3 refills | Status: DC

## 2016-11-04 MED ORDER — LORATADINE 10 MG PO TABS: 10.0000 mg | ORAL_TABLET | Freq: Every day | ORAL | 3 refills | Status: DC

## 2016-11-04 NOTE — Assessment & Plan Note (Addendum)
DBP slightly elevated. Agreed to start low dose lisinopril. Will recheck BMP next week.

## 2016-11-04 NOTE — Assessment & Plan Note (Signed)
Stable on claritin. Will continue. Advised no decongestants

## 2016-11-04 NOTE — Progress Notes (Signed)
Pre visit review using our clinic review tool, if applicable. No additional management support is needed unless otherwise documented below in the visit note. 

## 2016-11-04 NOTE — Progress Notes (Signed)
Subjective:    Patient ID: Tony Scott, male    DOB: 27-Oct-1960, 56 y.o.   MRN: [id number]  CC: Tony Scott is a 56 y.o. male who presents today for follow up.   HPI:   htn- compliant with medication. Doesn't check at home. Denies exertional chest pain or pressure, numbness or tingling radiating to left arm or jaw, palpitations, dizziness, frequent headaches, changes in vision, or shortness of breath.   Seasonal allergies- controlled on claritin; has been on 20 years; tried many other medication for allergies, without relief.   CPE labs today  No urine hesistancy or concern for stds. Declines prostate exam.        HISTORY:  Past Medical History:  Diagnosis Date  . Allergy    Hay fever  . Anal fissure   . Arthritis   . Back pain, chronic    abnormality L4-5, per pt  . Blood in stool   . Colon polyp   . Kidney stones    Past Surgical History:  Procedure Laterality Date  . NOSE SURGERY     Family History  Problem Relation Age of Onset  . Colon cancer Mother   . Heart disease Mother   . Hypertension Mother   . Hyperlipidemia Father   . Heart disease Father   . Hypertension Father   . Diabetes Father   . Arthritis Maternal Grandmother   . Hyperlipidemia Maternal Grandmother   . Hypertension Maternal Grandmother   . Rheum arthritis Sister   . Heart disease Paternal Grandfather     Allergies: Patient has no known allergies. No current outpatient prescriptions on file prior to visit.   No current facility-administered medications on file prior to visit.     Social History  Substance Use Topics  . Smoking status: Never Smoker  . Smokeless tobacco: Never Used  . Alcohol use 4.0 oz/week    8 drink(s) per week     Comment: social    Review of Systems  Constitutional: Negative for chills and fever.  Respiratory: Negative for cough.   Cardiovascular: Negative for chest pain and palpitations.  Gastrointestinal: Negative for nausea and vomiting.    Genitourinary: Negative for difficulty urinating.      Objective:    BP 135/90   Pulse 67   Temp 98 F (36.7 C) (Oral)   Ht 6' (1.829 m)   Wt 219 lb 12.8 oz (99.7 kg)   SpO2 98%   BMI 29.81 kg/m  BP Readings from Last 3 Encounters:  11/04/16 135/90  01/06/16 128/80  11/22/15 130/88   Wt Readings from Last 3 Encounters:  11/04/16 219 lb 12.8 oz (99.7 kg)  01/06/16 211 lb 6.4 oz (95.9 kg)  11/22/15 207 lb 9.6 oz (94.2 kg)    Physical Exam  Constitutional: He appears well-developed and well-nourished.  Neck: No thyroid mass and no thyromegaly present.  Cardiovascular: Regular rhythm and normal heart sounds.   Pulmonary/Chest: Effort normal and breath sounds normal. No respiratory distress. He has no wheezes. He has no rhonchi. He has no rales.  Lymphadenopathy:       Head (right side): No submental, no submandibular, no tonsillar, no preauricular, no posterior auricular and no occipital adenopathy present.       Head (left side): No submental, no submandibular, no tonsillar, no preauricular, no posterior auricular and no occipital adenopathy present.    He has no cervical adenopathy.    He has no axillary adenopathy.  Neurological: He is alert.  Skin: Skin is warm and dry.  Psychiatric: He has a normal mood and affect. His speech is normal and behavior is normal.  Vitals reviewed.      Assessment & Plan:   Problem List Items Addressed This Visit      Cardiovascular and Mediastinum   HTN (hypertension) - Primary    DBP slightly elevated. Agreed to start low dose lisinopril. Will recheck BMP next week.       Relevant Medications   amLODipine (NORVASC) 10 MG tablet   hydrochlorothiazide (MICROZIDE) 12.5 MG capsule   lisinopril (PRINIVIL,ZESTRIL) 5 MG tablet   Other Relevant Orders   CBC with Differential/Platelet (Completed)   Comprehensive metabolic panel (Completed)   Hemoglobin A1c (Completed)   Hepatitis C antibody (Completed)   HIV antibody (Completed)    Lipid panel (Completed)   TSH (Completed)   VITAMIN D 25 Hydroxy (Vit-D Deficiency, Fractures) (Completed)   PSA (Completed)   Ambulatory referral to Gastroenterology   Basic metabolic panel     Respiratory   Allergic rhinitis    Stable on claritin. Will continue. Advised no decongestants      Relevant Medications   loratadine (CLARITIN) 10 MG tablet       I have discontinued Tony Scott loratadine. I am also having him start on loratadine and lisinopril. Additionally, I am having him maintain his amLODipine and hydrochlorothiazide.   Meds ordered this encounter  Medications  . amLODipine (NORVASC) 10 MG tablet    Sig: Take 1 tablet (10 mg total) by mouth daily.    Dispense:  90 tablet    Refill:  3  . hydrochlorothiazide (MICROZIDE) 12.5 MG capsule    Sig: Take 1 capsule (12.5 mg total) by mouth daily.    Dispense:  90 capsule    Refill:  3  . loratadine (CLARITIN) 10 MG tablet    Sig: Take 1 tablet (10 mg total) by mouth daily.    Dispense:  90 tablet    Refill:  3    Order Specific Question:   Supervising Provider    Answer:   Deborra Medina L [[id number]]  . lisinopril (PRINIVIL,ZESTRIL) 5 MG tablet    Sig: Take 1 tablet (5 mg total) by mouth daily.    Dispense:  90 tablet    Refill:  3    Order Specific Question:   Supervising Provider    Answer:   Crecencio Mc [[id number]]    Return precautions given.   Risks, benefits, and alternatives of the medications and treatment plan prescribed today were discussed, and patient expressed understanding.   Education regarding symptom management and diagnosis given to patient on AVS.  Continue to follow with Mable Paris, FNP for routine health maintenance.   Alfredia Ferguson and I agreed with plan.   Mable Paris, FNP

## 2016-11-04 NOTE — Patient Instructions (Signed)
Monitor blood pressure, goal < 140/90  Labs today and then again next week to recheck electrolytes on new medication   Pleasure meeting you

## 2016-11-05 ENCOUNTER — Encounter: Payer: Self-pay | Admitting: Family

## 2016-11-05 LAB — HEPATITIS C ANTIBODY: HCV Ab: NEGATIVE

## 2016-11-05 LAB — HIV ANTIBODY (ROUTINE TESTING W REFLEX): HIV 1&2 Ab, 4th Generation: NONREACTIVE

## 2016-11-09 ENCOUNTER — Encounter: Payer: Self-pay | Admitting: *Deleted

## 2016-11-18 ENCOUNTER — Encounter: Payer: Self-pay | Admitting: Family

## 2016-11-26 ENCOUNTER — Encounter: Payer: Self-pay | Admitting: Family

## 2016-12-22 ENCOUNTER — Encounter: Payer: Self-pay | Admitting: *Deleted

## 2017-04-28 ENCOUNTER — Encounter: Payer: Self-pay | Admitting: Internal Medicine

## 2017-10-18 ENCOUNTER — Other Ambulatory Visit: Payer: Self-pay | Admitting: Family

## 2017-10-18 DIAGNOSIS — I10 Essential (primary) hypertension: Secondary | ICD-10-CM

## 2017-10-18 DIAGNOSIS — J302 Other seasonal allergic rhinitis: Secondary | ICD-10-CM

## 2017-11-01 ENCOUNTER — Other Ambulatory Visit: Payer: Self-pay | Admitting: Family

## 2017-11-01 DIAGNOSIS — I10 Essential (primary) hypertension: Secondary | ICD-10-CM

## 2017-11-22 ENCOUNTER — Encounter: Payer: Self-pay | Admitting: Family

## 2017-11-23 NOTE — Telephone Encounter (Signed)
Left voicemail to call , needs to schedule appointment

## 2017-11-25 NOTE — Telephone Encounter (Signed)
Patient has appointment scheduled 12/17/17

## 2017-12-03 ENCOUNTER — Telehealth: Payer: Self-pay

## 2017-12-03 NOTE — Telephone Encounter (Signed)
Patient states he is experiencing  i memory loss, joint aching, trouble sleeping.   Previously tested for lymes disease 2017and it was negative.    Would like to come in for blood work . Please advise.  Office visit scheduled for 12/13/17

## 2017-12-03 NOTE — Telephone Encounter (Signed)
Copied from [place] [id number]. Topic: General - Other >> Dec 03, 2017 10:52 AM Valla Leaver wrote: Reason for CRM: Patient calling to ask for order for lyme disease testing. Please call back once orders are placed.

## 2017-12-03 NOTE — Telephone Encounter (Signed)
Noted  He needs OV. We can dicuss sxs then

## 2017-12-13 ENCOUNTER — Ambulatory Visit (INDEPENDENT_AMBULATORY_CARE_PROVIDER_SITE_OTHER): Payer: Managed Care, Other (non HMO)

## 2017-12-13 ENCOUNTER — Encounter: Payer: Self-pay | Admitting: Family

## 2017-12-13 ENCOUNTER — Ambulatory Visit: Payer: Managed Care, Other (non HMO) | Admitting: Family

## 2017-12-13 VITALS — BP 134/74 | HR 62 | Temp 98.3°F | Resp 16 | Wt 219.4 lb

## 2017-12-13 DIAGNOSIS — Z125 Encounter for screening for malignant neoplasm of prostate: Secondary | ICD-10-CM

## 2017-12-13 DIAGNOSIS — I10 Essential (primary) hypertension: Secondary | ICD-10-CM

## 2017-12-13 DIAGNOSIS — M2550 Pain in unspecified joint: Secondary | ICD-10-CM

## 2017-12-13 DIAGNOSIS — R413 Other amnesia: Secondary | ICD-10-CM | POA: Diagnosis not present

## 2017-12-13 DIAGNOSIS — Z86010 Personal history of colonic polyps: Secondary | ICD-10-CM

## 2017-12-13 NOTE — Progress Notes (Signed)
Subjective:    Patient ID: Tony Scott, male    DOB: [birth year], 57 y.o.   MRN: [id number]  CC: Tony Scott is a 57 y.o. male who presents today for follow up.   HPI: CC: memory loss over past couple of years, unchanged. 'Doesn't feel sharp. '  Hasn't forgotten loved ones names, gotten lost. No family member with dementia.  Not sleeping well. Falls asleep and then wakes up. Told he snores. Never has felt refreshed.   No anxiety or depression. Doesn't believe in ADHD.   Joints 'have always been 'precarious' , in particularly left knee is hurts, which has worsened over last 6 months. Right knee bother him too. No pain or swelling in fingers, wrists. Left knee hurts with walking,bending, stairs. Stiffness. No swelling in knees. No rash ,fever, unexplained weight changes. Hasn't  given way. Hasn't tried  ace wrap, ice. Ibuprofen without relief ( takes this for gout per patient which present in great toe). Walks dog in morning. No formal exercise.   HTN- compliant with medications. No chest pain.   He also has concern for lyme disease. Reports tick bite right thigh 2 years ago on right thigh. Had bulls eye rash. Treated with doxcycline.    H/o gout    Due for colonoscopy HISTORY:  Past Medical History:  Diagnosis Date  . Allergy    Hay fever  . Anal fissure   . Arthritis   . Back pain, chronic    abnormality L4-5, per pt  . Blood in stool   . Colon polyp   . Kidney stones    Past Surgical History:  Procedure Laterality Date  . NOSE SURGERY     Family History  Problem Relation Age of Onset  . Colon cancer Mother   . Heart disease Mother   . Hypertension Mother   . Hyperlipidemia Father   . Heart disease Father   . Hypertension Father   . Diabetes Father   . Rheum arthritis Sister   . Arthritis Maternal Grandmother   . Hyperlipidemia Maternal Grandmother   . Hypertension Maternal Grandmother   . Heart disease Paternal Grandfather     Allergies: Patient has no  known allergies. Current Outpatient Medications on File Prior to Visit  Medication Sig Dispense Refill  . amLODipine (NORVASC) 10 MG tablet TAKE 1 TABLET DAILY 90 tablet 0  . hydrochlorothiazide (MICROZIDE) 12.5 MG capsule Take 1 capsule (12.5 mg total) by mouth daily. 90 capsule 0  . lisinopril (PRINIVIL,ZESTRIL) 5 MG tablet TAKE 1 TABLET DAILY 90 tablet 0  . loratadine (CLARITIN) 10 MG tablet TAKE 1 TABLET DAILY 90 tablet 0   No current facility-administered medications on file prior to visit.     Social History   Tobacco Use  . Smoking status: Never Smoker  . Smokeless tobacco: Never Used  Substance Use Topics  . Alcohol use: Yes    Alcohol/week: 4.0 oz    Types: 8 drink(s) per week    Comment: social  . Drug use: No    Review of Systems  Constitutional: Negative for chills and fever.  Respiratory: Negative for cough.   Cardiovascular: Negative for chest pain and palpitations.  Gastrointestinal: Negative for nausea and vomiting.  Musculoskeletal: Positive for arthralgias and back pain (chronic 30_+ years).  Skin: Negative for rash and wound.  Neurological: Negative for headaches.  Psychiatric/Behavioral: Positive for sleep disturbance. Negative for confusion. The patient is not nervous/anxious.       Objective:  BP 134/74 (BP Location: Left Arm, Patient Position: Sitting, Cuff Size: Large)   Pulse 62   Temp 98.3 F (36.8 C) (Oral)   Resp 16   Wt 219 lb 6 oz (99.5 kg)   SpO2 98%   BMI 29.75 kg/m  BP Readings from Last 3 Encounters:  12/13/17 134/74  11/04/16 135/90  01/06/16 128/80   Wt Readings from Last 3 Encounters:  12/13/17 219 lb 6 oz (99.5 kg)  11/04/16 219 lb 12.8 oz (99.7 kg)  01/06/16 211 lb 6.4 oz (95.9 kg)    Physical Exam  Constitutional: He appears well-developed and well-nourished.  Cardiovascular: Regular rhythm and normal heart sounds.  Pulmonary/Chest: Effort normal and breath sounds normal. No respiratory distress. He has no wheezes.  He has no rhonchi. He has no rales.  Musculoskeletal:       Right knee: He exhibits normal range of motion, no swelling and no effusion. No tenderness found. No medial joint line tenderness noted.       Left knee: He exhibits normal range of motion, no swelling and no effusion. Tenderness found. Medial joint line tenderness noted. No lateral joint line tenderness noted.  Bilateral knees are symmetric. No effusion appreciated. No increase in warmth or erythema. Crepitus felt with flexion of bilateral knees.  Left knee:  Medial joint pain with palpation. Able to extend to -5 to 10 degrees and flex to 110 degrees. No catching with McMurray maneuver. No patellar apprehension. Negative anterior drawer and lachman's- no laxity appreciated.      Lymphadenopathy:       Head (left side): No submandibular and no preauricular adenopathy present.  Neurological: He is alert.  Skin: Skin is warm and dry.  Psychiatric: He has a normal mood and affect. His speech is normal and behavior is normal.  Vitals reviewed.      Assessment & Plan:   Problem List Items Addressed This Visit      Cardiovascular and Mediastinum   HTN (hypertension)    At goal.  Continue current regimen.        Other   History of colonic polyps    Due for colonoscopy.  Ordered.      Arthralgia - Primary    Arthralgias nonspecific at this time.  reassured as it appears to be affecting his large joints such as his knees, primarily his left knee.  Pending x-ray.  Pending autoimmune serologies as patient sister does have rheumatoid arthritis.  Differential includes OA, gouty arthritis,  RA.   Lower suspicion of Lyme arthritis ( suspect Western blot IgG, IgM to be reactive) as confirmatory testing for lyme 2017 was not diagnostic. Close followup in 3 months to regroup; and as I explained to patient, advised very close vigilance in particular in context of multiple symptoms today.        Relevant Orders   Lyme Ab/Western Blot  Reflex   C-reactive protein   CYCLIC CITRUL PEPTIDE ANTIBODY, IGG/IGA   ANA   Rheumatoid factor   Sedimentation rate   Ambulatory referral to Gastroenterology   Comprehensive metabolic panel   Lipid panel   VITAMIN D 25 Hydroxy (Vit-D Deficiency, Fractures)   TSH   CBC with Differential/Platelet   Uric acid   DG Knee Complete 4 Views Left (Completed)   Memory changes    Etiology uncertain at this time.  Suspect that patient's poor sleep quality is likely contributory.  Patient politely declines neurocognitive testing, sleep study at this time.  No depression, anxiety Patient  is very alert and oriented today and has no concerning episodes regarding safety (getting lost, forgetting loved ones names).  Pending B12, folate, RPR, TSH.  Advised very close follow-up, 3 months.       Relevant Orders   B12 and Folate Panel   RPR    Other Visit Diagnoses    Screening for prostate cancer       Relevant Orders   PSA       I am having Johaan L. Ramires maintain his lisinopril, loratadine, amLODipine, and hydrochlorothiazide.   No orders of the defined types were placed in this encounter.   Return precautions given.   Risks, benefits, and alternatives of the medications and treatment plan prescribed today were discussed, and patient expressed understanding.   Education regarding symptom management and diagnosis given to patient on AVS.  Continue to follow with Burnard Hawthorne, FNP for routine health maintenance.   Alfredia Ferguson and I agreed with plan.   Mable Paris, FNP

## 2017-12-13 NOTE — Patient Instructions (Addendum)
Labs when you are fasting.   Xray today  Please stay very vigilant while we try to get answers   Follow up 3 months.

## 2017-12-13 NOTE — Assessment & Plan Note (Addendum)
Arthralgias nonspecific at this time.  reassured as it appears to be affecting his large joints such as his knees, primarily his left knee.  Pending x-ray.  Pending autoimmune serologies as patient sister does have rheumatoid arthritis.  Differential includes OA, gouty arthritis,  RA.   Lower suspicion of Lyme arthritis ( suspect Western blot IgG, IgM to be reactive) as confirmatory testing for lyme 2017 was not diagnostic. Close followup in 3 months to regroup; and as I explained to patient, advised very close vigilance in particular in context of multiple symptoms today.

## 2017-12-13 NOTE — Assessment & Plan Note (Signed)
Etiology uncertain at this time.  Suspect that patient's poor sleep quality is likely contributory.  Patient politely declines neurocognitive testing, sleep study at this time.  No depression, anxiety Patient is very alert and oriented today and has no concerning episodes regarding safety (getting lost, forgetting loved ones names).  Pending B12, folate, RPR, TSH.  Advised very close follow-up, 3 months.

## 2017-12-13 NOTE — Assessment & Plan Note (Signed)
At goal. Continue current regimen. 

## 2017-12-13 NOTE — Assessment & Plan Note (Signed)
Due for colonoscopy. Ordered.

## 2017-12-17 ENCOUNTER — Ambulatory Visit: Payer: Managed Care, Other (non HMO) | Admitting: Family

## 2017-12-20 ENCOUNTER — Encounter: Payer: Self-pay | Admitting: Family

## 2017-12-24 ENCOUNTER — Other Ambulatory Visit (INDEPENDENT_AMBULATORY_CARE_PROVIDER_SITE_OTHER): Payer: Managed Care, Other (non HMO)

## 2017-12-24 DIAGNOSIS — R413 Other amnesia: Secondary | ICD-10-CM

## 2017-12-24 DIAGNOSIS — M2550 Pain in unspecified joint: Secondary | ICD-10-CM

## 2017-12-24 DIAGNOSIS — Z125 Encounter for screening for malignant neoplasm of prostate: Secondary | ICD-10-CM | POA: Diagnosis not present

## 2017-12-24 NOTE — Addendum Note (Signed)
Addended by: Arby Barrette on: 12/24/2017 09:28 AM   Modules accepted: Orders

## 2017-12-27 ENCOUNTER — Encounter: Payer: Self-pay | Admitting: *Deleted

## 2017-12-29 LAB — COMPREHENSIVE METABOLIC PANEL
ALT: 27 IU/L (ref 0–44)
AST: 21 IU/L (ref 0–40)
Albumin/Globulin Ratio: 1.8 (ref 1.2–2.2)
Albumin: 4.6 g/dL (ref 3.5–5.5)
Alkaline Phosphatase: 56 IU/L (ref 39–117)
BUN/Creatinine Ratio: 17 (ref 9–20)
BUN: 17 mg/dL (ref 6–24)
Bilirubin Total: 0.6 mg/dL (ref 0.0–1.2)
CO2: 25 mmol/L (ref 20–29)
Calcium: 9.5 mg/dL (ref 8.7–10.2)
Chloride: 101 mmol/L (ref 96–106)
Creatinine, Ser: 1.03 mg/dL (ref 0.76–1.27)
GFR calc Af Amer: 94 mL/min/{1.73_m2} (ref >59)
GFR calc non Af Amer: 81 mL/min/{1.73_m2} (ref >59)
Globulin, Total: 2.5 g/dL (ref 1.5–4.5)
Glucose: 106 mg/dL — ABNORMAL HIGH (ref 65–99)
Potassium: 4.7 mmol/L (ref 3.5–5.2)
Sodium: 141 mmol/L (ref 134–144)
Total Protein: 7.1 g/dL (ref 6.0–8.5)

## 2017-12-29 LAB — CYCLIC CITRUL PEPTIDE ANTIBODY, IGG/IGA: Cyclic Citrullin Peptide Ab: 7 units (ref 0–19)

## 2017-12-29 LAB — RHEUMATOID FACTOR: Rhuematoid fact SerPl-aCnc: <10 IU/mL (ref 0.0–13.9)

## 2017-12-29 LAB — LIPID PANEL
Chol/HDL Ratio: 4.1 ratio (ref 0.0–5.0)
Cholesterol, Total: 236 mg/dL — ABNORMAL HIGH (ref 100–199)
HDL: 58 mg/dL (ref >39)
LDL Calculated: 156 mg/dL — ABNORMAL HIGH (ref 0–99)
Triglycerides: 109 mg/dL (ref 0–149)
VLDL Cholesterol Cal: 22 mg/dL (ref 5–40)

## 2017-12-29 LAB — CBC WITH DIFFERENTIAL/PLATELET
Basophils Absolute: 0 10*3/uL (ref 0.0–0.2)
Basos: 0 %
EOS (ABSOLUTE): 0.5 10*3/uL — ABNORMAL HIGH (ref 0.0–0.4)
Eos: 8 %
Hematocrit: 45.5 % (ref 37.5–51.0)
Hemoglobin: 15.4 g/dL (ref 13.0–17.7)
Immature Grans (Abs): 0 10*3/uL (ref 0.0–0.1)
Immature Granulocytes: 0 %
Lymphocytes Absolute: 1.4 10*3/uL (ref 0.7–3.1)
Lymphs: 24 %
MCH: 30.4 pg (ref 26.6–33.0)
MCHC: 33.8 g/dL (ref 31.5–35.7)
MCV: 90 fL (ref 79–97)
Monocytes Absolute: 0.5 10*3/uL (ref 0.1–0.9)
Monocytes: 8 %
Neutrophils Absolute: 3.4 10*3/uL (ref 1.4–7.0)
Neutrophils: 60 %
Platelets: 250 10*3/uL (ref 150–450)
RBC: 5.06 x10E6/uL (ref 4.14–5.80)
RDW: 14 % (ref 12.3–15.4)
WBC: 5.7 10*3/uL (ref 3.4–10.8)

## 2017-12-29 LAB — URIC ACID: Uric Acid: 9.1 mg/dL — ABNORMAL HIGH (ref 3.7–8.6)

## 2017-12-29 LAB — RPR: RPR Ser Ql: NONREACTIVE

## 2017-12-29 LAB — B12 AND FOLATE PANEL
Folate: 10.1 ng/mL (ref >3.0)
Vitamin B-12: 384 pg/mL (ref 232–1245)

## 2017-12-29 LAB — PSA: Prostate Specific Ag, Serum: 2.9 ng/mL (ref 0.0–4.0)

## 2017-12-29 LAB — C-REACTIVE PROTEIN: CRP: 2.5 mg/L (ref 0.0–4.9)

## 2017-12-29 LAB — VITAMIN D 25 HYDROXY (VIT D DEFICIENCY, FRACTURES): Vit D, 25-Hydroxy: 23.7 ng/mL — ABNORMAL LOW (ref 30.0–100.0)

## 2017-12-29 LAB — LYME AB/WESTERN BLOT REFLEX
LYME DISEASE AB, QUANT, IGM: <0.8 index (ref 0.00–0.79)
Lyme IgG/IgM Ab: <0.91 {ISR} (ref 0.00–0.90)

## 2017-12-29 LAB — ANA: Anti Nuclear Antibody(ANA): NEGATIVE

## 2017-12-29 LAB — TSH: TSH: 2.35 u[IU]/mL (ref 0.450–4.500)

## 2017-12-29 LAB — SEDIMENTATION RATE: Sed Rate: 16 mm/hr (ref 0–30)

## 2018-01-16 ENCOUNTER — Other Ambulatory Visit: Payer: Self-pay | Admitting: Family

## 2018-01-16 DIAGNOSIS — I10 Essential (primary) hypertension: Secondary | ICD-10-CM

## 2018-01-17 ENCOUNTER — Other Ambulatory Visit: Payer: Self-pay | Admitting: Family

## 2018-01-17 DIAGNOSIS — J302 Other seasonal allergic rhinitis: Secondary | ICD-10-CM

## 2018-01-30 ENCOUNTER — Other Ambulatory Visit: Payer: Self-pay | Admitting: Family

## 2018-01-30 DIAGNOSIS — I10 Essential (primary) hypertension: Secondary | ICD-10-CM

## 2018-03-21 ENCOUNTER — Encounter: Payer: Self-pay | Admitting: Family

## 2018-03-21 ENCOUNTER — Ambulatory Visit (INDEPENDENT_AMBULATORY_CARE_PROVIDER_SITE_OTHER): Payer: Managed Care, Other (non HMO) | Admitting: Family

## 2018-03-21 VITALS — BP 120/70 | HR 66 | Temp 98.0°F | Resp 16 | Ht 72.0 in | Wt 216.4 lb

## 2018-03-21 DIAGNOSIS — I10 Essential (primary) hypertension: Secondary | ICD-10-CM

## 2018-03-21 DIAGNOSIS — L989 Disorder of the skin and subcutaneous tissue, unspecified: Secondary | ICD-10-CM | POA: Diagnosis not present

## 2018-03-21 DIAGNOSIS — R413 Other amnesia: Secondary | ICD-10-CM

## 2018-03-21 DIAGNOSIS — M2550 Pain in unspecified joint: Secondary | ICD-10-CM

## 2018-03-21 NOTE — Assessment & Plan Note (Signed)
Chronic. Persistent. Pending eval neurocognitive testing

## 2018-03-21 NOTE — Assessment & Plan Note (Addendum)
Chronic left knee pain. Unchanged. Declines orthopedic referral and trial of cymbalta.He will let me know how he is doing

## 2018-03-21 NOTE — Assessment & Plan Note (Signed)
C/w lipoma, dermoid cyst. Referral to dermatology for formal evaluation.

## 2018-03-21 NOTE — Assessment & Plan Note (Signed)
At goal.  Continue regimen. 

## 2018-03-21 NOTE — Patient Instructions (Addendum)
Let me know orthopedics.   Please schedule colonoscopy when you can.   Today we discussed referrals, orders. Neutrocognitive testing, dermatology   I have placed these orders in the system for you.  Please be sure to give [place] a call if you have not heard from our office regarding scheduling a test or regarding referral in a timely manner.  It is very important that you let me know as soon as possible.

## 2018-03-21 NOTE — Progress Notes (Signed)
Subjective:    Patient ID: Tony Scott, male    DOB: August 02, 1960, 57 y.o.   MRN: [id number]  CC: Tony Scott is a 57 y.o. male who presents today for follow up.   HPI: Feel well today  HTN- compliant with medication Denies exertional chest pain or pressure, numbness or tingling radiating to left arm or jaw, palpitations, dizziness, frequent headaches, changes in vision, or shortness of breath.   Cyst on right upper thigh for several months, unchanged. Discomfort when pressing. Otherwise, not bothersome. No redness, discharge.   Left knee pain- chronic. Unchanged. Stairs are hard. No falls.   Memory concerns persist. No family h/o dementia. Havent gotten lost.   Colonoscopy  due  HISTORY:  Past Medical History:  Diagnosis Date  . Allergy    Hay fever  . Anal fissure   . Arthritis   . Back pain, chronic    abnormality L4-5, per pt  . Blood in stool   . Colon polyp   . Kidney stones    Past Surgical History:  Procedure Laterality Date  . NOSE SURGERY     Family History  Problem Relation Age of Onset  . Colon cancer Mother   . Heart disease Mother   . Hypertension Mother   . Hyperlipidemia Father   . Heart disease Father   . Hypertension Father   . Diabetes Father   . Rheum arthritis Sister   . Arthritis Maternal Grandmother   . Hyperlipidemia Maternal Grandmother   . Hypertension Maternal Grandmother   . Heart disease Paternal Grandfather     Allergies: Patient has no known allergies. Current Outpatient Medications on File Prior to Visit  Medication Sig Dispense Refill  . amLODipine (NORVASC) 10 MG tablet Take 1 tablet (10 mg total) by mouth daily. 90 tablet 0  . hydrochlorothiazide (MICROZIDE) 12.5 MG capsule Take 1 capsule (12.5 mg total) by mouth daily. 90 capsule 0  . lisinopril (PRINIVIL,ZESTRIL) 5 MG tablet TAKE 1 TABLET DAILY 90 tablet 1  . loratadine (CLARITIN) 10 MG tablet TAKE 1 TABLET DAILY 90 tablet 0   No current facility-administered medications on file prior to visit.     Social History   Tobacco Use  . Smoking status: Never Smoker  . Smokeless tobacco: Never Used  Substance  Use Topics  . Alcohol use: Yes    Alcohol/week: 8.0 standard drinks    Types: 8 drink(s) per week    Comment: social  . Drug use: No    Review of Systems  Constitutional: Negative for chills and fever.  Respiratory: Negative for cough.   Cardiovascular: Negative for chest pain and palpitations.  Gastrointestinal: Negative for nausea and vomiting.  Musculoskeletal: Positive for arthralgias (knee pain).  Skin: Negative for color change and wound.      Objective:    BP 120/70 (BP Location: Left Arm, Patient Position: Sitting, Cuff Size: Large)   Pulse 66   Temp 98 F (36.7 C) (Oral)   Resp 16   Ht 6' (1.829 m)   Wt 216 lb 6 oz (98.1 kg)   SpO2 96%   BMI 29.35 kg/m  BP Readings from Last 3 Encounters:  03/21/18 120/70  12/13/17 134/74  11/04/16 135/90   Wt Readings from Last 3 Encounters:  03/21/18 216 lb 6 oz (98.1 kg)  12/13/17 219 lb 6 oz (99.5 kg)  11/04/16 219 lb 12.8 oz (99.7 kg)    Physical Exam  Constitutional: He appears well-developed and well-nourished.  Cardiovascular: Regular rhythm and normal heart sounds.  Pulmonary/Chest: Effort normal and breath sounds normal. No respiratory distress. He has no wheezes. He has no rhonchi. He has no rales.  Lymphadenopathy:       Head (left side): No submandibular and no preauricular adenopathy present.  Neurological: He is alert.  Skin: Skin is warm and dry.     Discrete non tender soft mass appreciated, approx 3cm in diameter. Non fluctuant. No erythema, increased warmth.   Psychiatric: He has a normal mood and affect. His speech is normal and behavior is normal.  Vitals reviewed.      Assessment & Plan:   Problem List Items Addressed This Visit      Cardiovascular and Mediastinum  HTN (hypertension) - Primary    At goal. Continue regimen        Musculoskeletal and Integument   Skin lesion    C/w lipoma, dermoid cyst. Referral to dermatology for formal evaluation.       Relevant Orders    Ambulatory referral to Dermatology     Other   Arthralgia    Chronic left knee pain. Unchanged. Declines orthopedic referral and trial of cymbalta.He will let me know how he is doing      Memory changes    Chronic. Persistent. Pending eval neurocognitive testing      Relevant Orders   Ambulatory referral to Neurology       I am having Cecilie Lowers L. Meeuwsen maintain his lisinopril, loratadine, amLODipine, and hydrochlorothiazide.   No orders of the defined types were placed in this encounter.   Return precautions given.   Risks, benefits, and alternatives of the medications and treatment plan prescribed today were discussed, and patient expressed understanding.   Education regarding symptom management and diagnosis given to patient on AVS.  Continue to follow with Burnard Hawthorne, FNP for routine health maintenance.   Alfredia Ferguson and I agreed with plan.   Mable Paris, FNP

## 2018-04-18 ENCOUNTER — Other Ambulatory Visit: Payer: Self-pay | Admitting: Family

## 2018-04-18 DIAGNOSIS — J302 Other seasonal allergic rhinitis: Secondary | ICD-10-CM

## 2018-05-01 ENCOUNTER — Other Ambulatory Visit: Payer: Self-pay | Admitting: Family

## 2018-05-01 DIAGNOSIS — I10 Essential (primary) hypertension: Secondary | ICD-10-CM

## 2018-07-16 ENCOUNTER — Other Ambulatory Visit: Payer: Self-pay | Admitting: Family

## 2018-07-16 DIAGNOSIS — I10 Essential (primary) hypertension: Secondary | ICD-10-CM

## 2018-10-15 ENCOUNTER — Other Ambulatory Visit: Payer: Self-pay | Admitting: Family

## 2018-10-15 DIAGNOSIS — J302 Other seasonal allergic rhinitis: Secondary | ICD-10-CM

## 2018-11-02 ENCOUNTER — Encounter: Payer: Self-pay | Admitting: Family

## 2018-11-04 ENCOUNTER — Ambulatory Visit (INDEPENDENT_AMBULATORY_CARE_PROVIDER_SITE_OTHER): Payer: Managed Care, Other (non HMO) | Admitting: Internal Medicine

## 2018-11-04 ENCOUNTER — Encounter: Payer: Self-pay | Admitting: Internal Medicine

## 2018-11-04 ENCOUNTER — Telehealth: Payer: Self-pay

## 2018-11-04 DIAGNOSIS — M109 Gout, unspecified: Secondary | ICD-10-CM | POA: Diagnosis not present

## 2018-11-04 DIAGNOSIS — I10 Essential (primary) hypertension: Secondary | ICD-10-CM | POA: Diagnosis not present

## 2018-11-04 MED ORDER — PREDNISONE 20 MG PO TABS: ORAL_TABLET | ORAL | 0 refills | Status: DC

## 2018-11-04 MED ORDER — COLCHICINE 0.6 MG PO TABS: ORAL_TABLET | ORAL | 0 refills | Status: DC

## 2018-11-04 NOTE — Patient Instructions (Signed)
Low-Purine Eating Plan A low-purine eating plan involves making food choices to limit your intake of purine. Purine is a kind of uric acid. Too much uric acid in your blood can cause certain conditions, such as gout and kidney stones. Eating a low-purine diet can help control these conditions. What are tips for following this plan? Reading food labels   Avoid foods with saturated or Trans fat.  Check the ingredient list of grains-based foods, such as bread and cereal, to make sure that they contain whole grains.  Check the ingredient list of sauces or soups to make sure they do not contain meat or fish.  When choosing soft drinks, check the ingredient list to make sure they do not contain high-fructose corn syrup. Shopping  Buy plenty of fresh fruits and vegetables.  Avoid buying canned or fresh fish.  Buy dairy products labeled as low-fat or nonfat.  Avoid buying premade or processed foods. These foods are often high in fat, salt (sodium), and added sugar. Cooking  Use olive oil instead of butter when cooking. Oils like olive oil, canola oil, and sunflower oil contain healthy fats. Meal planning  Learn which foods do or do not affect you. If you find out that a food tends to cause your gout symptoms to flare up, avoid eating that food. You can enjoy foods that do not cause problems. If you have any questions about a food item, talk with your dietitian or health care provider.  Limit foods high in fat, especially saturated fat. Fat makes it harder for your body to get rid of uric acid.  Choose foods that are lower in fat and are lean sources of protein. General guidelines  Limit alcohol intake to no more than 1 drink a day for nonpregnant women and 2 drinks a day for men. One drink equals 12 oz of beer, 5 oz of wine, or 1 oz of hard liquor. Alcohol can affect the way your body gets rid of uric acid.  Drink plenty of water to keep your urine clear or pale yellow. Fluids can help  remove uric acid from your body.  If directed by your health care provider, take a vitamin C supplement.  Work with your health care provider and dietitian to develop a plan to achieve or maintain a healthy weight. Losing weight can help reduce uric acid in your blood. What foods are recommended? The items listed may not be a complete list. Talk with your dietitian about what dietary choices are best for you. Foods low in purines Foods low in purines do not need to be limited. These include:  All fruits.  All low-purine vegetables, pickles, and olives.  Breads, pasta, rice, cornbread, and popcorn. Cake and other baked goods.  All dairy foods.  Eggs, nuts, and nut butters.  Spices and condiments, such as salt, herbs, and vinegar.  Plant oils, butter, and margarine.  Water, sugar-free soft drinks, tea, coffee, and cocoa.  Vegetable-based soups, broths, sauces, and gravies. Foods moderate in purines Foods moderate in purines should be limited to the amounts listed.   cup of asparagus, cauliflower, spinach, mushrooms, or green peas, each day.  2/3 cup uncooked oatmeal, each day.   cup dry wheat bran or wheat germ, each day.  2-3 ounces of meat or poultry, each day.  4-6 ounces of shellfish, such as crab, lobster, oysters, or shrimp, each day.  1 cup cooked beans, peas, or lentils, each day.  Soup, broths, or bouillon made from meat or   fish. Limit these foods as much as possible. What foods are not recommended? The items listed may not be a complete list. Talk with your dietitian about what dietary choices are best for you. Limit your intake of foods high in purines, including:  Beer and other alcohol.  Meat-based gravy or sauce.  Canned or fresh fish, such as: ? Anchovies, sardines, herring, and tuna. ? Mussels and scallops. ? Codfish, trout, and haddock.  Berniece Salines.  Organ meats, such as: ? Liver or kidney. ? Tripe. ? Sweetbreads (thymus gland or pancreas).   Wild [employer] biochemist.  Yeast or yeast extract supplements.  Drinks sweetened with high-fructose corn syrup. Summary  Eating a low-purine diet can help control conditions caused by too much uric acid in the body, such as gout or kidney stones.  Choose low-purine foods, limit alcohol, and limit foods high in fat.  You will learn over time which foods do or do not affect you. If you find out that a food tends to cause your gout symptoms to flare up, avoid eating that food. This information is not intended to replace advice given to you by your health care provider. Make sure you discuss any questions you have with your health care provider. Document Released: 10/24/2010 Document Revised: 08/12/2016 Document Reviewed: 08/12/2016 Elsevier Interactive Patient Education  2019 [employer].  Gout  Gout is a condition that causes painful swelling of the joints. Gout is a type of inflammation of the joints (arthritis). This condition is caused by having too much uric acid in the body. Uric acid is a chemical that forms when the body breaks down substances called purines. Purines are important for building body proteins. When the body has too much uric acid, sharp crystals can form and build up inside the joints. This causes pain and swelling. Gout attacks can happen quickly and may be very painful (acute gout). Over time, the attacks can affect more joints and become more frequent (chronic gout). Gout can also cause uric acid to build up under the skin and inside the kidneys. What are the causes? This condition is caused by too much uric acid in your blood. This can happen because:  Your kidneys do not remove enough uric acid from your blood. This is the most common cause.  Your body makes too much uric acid. This can happen with some cancers and cancer treatments. It can also occur if your body is breaking down too many red blood cells (hemolytic anemia).  You eat too many foods that are high in  purines. These foods include organ meats and some seafood. Alcohol, especially beer, is also high in purines. A gout attack may be triggered by trauma or stress. What increases the risk? You are more likely to develop this condition if you:  Have a family history of gout.  Are male and middle-aged.  Are male and have gone through menopause.  Are obese.  Frequently drink alcohol, especially beer.  Are dehydrated.  Lose weight too quickly.  Have an organ transplant.  Have lead poisoning.  Take certain medicines, including aspirin, cyclosporine, diuretics, levodopa, and niacin.  Have kidney disease.  Have a skin condition called psoriasis. What are the signs or symptoms? An attack of acute gout happens quickly. It usually occurs in just one joint. The most common place is the big toe. Attacks often start at night. Other joints that may be affected include joints of the feet, ankle, knee, fingers, wrist, or elbow. Symptoms of this condition  may include:  Severe pain.  Warmth.  Swelling.  Stiffness.  Tenderness. The affected joint may be very painful to touch.  Shiny, red, or purple skin.  Chills and fever. Chronic gout may cause symptoms more frequently. More joints may be involved. You may also have white or yellow lumps (tophi) on your hands or feet or in other areas near your joints. How is this diagnosed? This condition is diagnosed based on your symptoms, medical history, and physical exam. You may have tests, such as:  Blood tests to measure uric acid levels.  Removal of joint fluid with a thin needle (aspiration) to look for uric acid crystals.  X-rays to look for joint damage. How is this treated? Treatment for this condition has two phases: treating an acute attack and preventing future attacks. Acute gout treatment may include medicines to reduce pain and swelling, including:  NSAIDs.  Steroids. These are strong anti-inflammatory medicines that can be  taken by mouth (orally) or injected into a joint.  Colchicine. This medicine relieves pain and swelling when it is taken soon after an attack. It can be given by mouth or through an IV. Preventive treatment may include:  Daily use of smaller doses of NSAIDs or colchicine.  Use of a medicine that reduces uric acid levels in your blood.  Changes to your diet. You may need to see a dietitian about what to eat and drink to prevent gout. Follow these instructions at home: During a gout attack   If directed, put ice on the affected area: ? Put ice in a plastic bag. ? Place a towel between your skin and the bag. ? Leave the ice on for 20 minutes, 2-3 times a day.  Raise (elevate) the affected joint above the level of your heart as often as possible.  Rest the joint as much as possible. If the affected joint is in your leg, you may be given crutches to use.  Follow instructions from your health care provider about eating or drinking restrictions. Avoiding future gout attacks  Follow a low-purine diet as told by your dietitian or health care provider. Avoid foods and drinks that are high in purines, including liver, kidney, anchovies, asparagus, herring, mushrooms, mussels, and beer.  Maintain a healthy weight or lose weight if you are overweight. If you want to lose weight, talk with your health care provider. It is important that you do not lose weight too quickly.  Start or maintain an exercise program as told by your health care provider. Eating and drinking  Drink enough fluids to keep your urine pale yellow.  If you drink alcohol: ? Limit how much you use to:  0-1 drink a day for women.  0-2 drinks a day for men. ? Be aware of how much alcohol is in your drink. In the [place] one drink equals one 12 oz bottle of beer (355 mL) one 5 oz glass of wine (148 mL), or one 1 oz glass of hard liquor (44 mL). General instructions  Take over-the-counter and prescription medicines only as  told by your health care provider.  Do not drive or use heavy machinery while taking prescription pain medicine.  Return to your normal activities as told by your health care provider. Ask your health care provider what activities are safe for you.  Keep all follow-up visits as told by your health care provider. This is important. Contact a health care provider if you have:  Another gout attack.  Continuing symptoms of a  gout attack after 10 days of treatment.  Side effects from your medicines.  Chills or a fever.  Burning pain when you urinate.  Pain in your lower back or belly. Get help right away if you:  Have severe or uncontrolled pain.  Cannot urinate. Summary  Gout is painful swelling of the joints caused by inflammation.  The most common site of pain is the big toe, but it can affect other joints in the body.  Medicines and dietary changes can help to prevent and treat gout attacks. This information is not intended to replace advice given to you by your health care provider. Make sure you discuss any questions you have with your health care provider. Document Released: 06/26/2000 Document Revised: 01/19/2018 Document Reviewed: 01/19/2018 Elsevier Interactive Patient Education  2019 [employer].

## 2018-11-04 NOTE — Telephone Encounter (Signed)
I left message for patient per mychart messageto call back to get him on Lauren's schedule for possible gout today.

## 2018-11-04 NOTE — Telephone Encounter (Signed)
Tried to reach patient by phone no answer  Left voicemail to please call office.

## 2018-11-04 NOTE — Progress Notes (Signed)
Virtual Visit via Video Note Doxy  I connected with Tony Scott  on 11/04/18 at  1:04 PM EDT by a video enabled telemedicine application and verified that I am speaking with the correct person using two identifiers.  Location patient: home Location provider:work  Persons participating in the virtual visit: patient, provider  I discussed the limitations of evaluation and management by telemedicine and the availability of in person appointments. The patient expressed understanding and agreed to proceed.   HPI: 1. Left great toe swollen painful x 3-4 days and h/o gout swelling goes from left great toe to shin no h/o trauma h/o gout pain is worse at night he has tried icing. Pain is 8/10. Tried Ibuprofen 400 mg 3-4 x per day Sat. He thought sweets were a trigger to gout in the past but now he thinks related to hydration  He reports prior on biometric screening his uric acid was elevated   2. HTN on hctz 12.5 mg qd, lis 5 mg qd, norvasc 10 mg qd    ROS: See pertinent positives and negatives per HPI.  Past Medical History:  Diagnosis Date  . Allergy    Hay fever  . Anal fissure   . Arthritis   . Back pain, chronic    abnormality L4-5, per pt  . Blood in stool   . Colon polyp   . Kidney stones     Past Surgical History:  Procedure Laterality Date  . NOSE SURGERY      Family History  Problem Relation Age of Onset  . Colon cancer Mother   . Heart disease Mother   . Hypertension Mother   . Hyperlipidemia Father   . Heart disease Father   . Hypertension Father   . Diabetes Father   . Rheum arthritis Sister   . Arthritis Maternal Grandmother   . Hyperlipidemia Maternal Grandmother   . Hypertension Maternal Grandmother   . Heart disease Paternal Grandfather     SOCIAL HX: working from home    Current Outpatient Medications:  .  amLODipine (NORVASC) 10 MG tablet, TAKE 1 TABLET DAILY, Disp: 90 tablet, Rfl: 4 .  colchicine 0.6 MG tablet, 1-2 pills (0.6-1.2 mg total) with  gout flare, repeat in 1 hr prn take another 0.6 pill. Max 1.8 mg/24 hrs, Disp: 30 tablet, Rfl: 0 .  hydrochlorothiazide (MICROZIDE) 12.5 MG capsule, TAKE 1 CAPSULE DAILY, Disp: 90 capsule, Rfl: 4 .  lisinopril (PRINIVIL,ZESTRIL) 5 MG tablet, TAKE 1 TABLET DAILY, Disp: 90 tablet, Rfl: 4 .  loratadine (CLARITIN) 10 MG tablet, TAKE 1 TABLET DAILY, Disp: 90 tablet, Rfl: 3 .  predniSONE (DELTASONE) 20 MG tablet, 40 mg x 4 days, 20 mg x 3 days and 10 mg x 3 days for gout flare, Disp: 14 tablet, Rfl: 0  EXAM:  VITALS per patient if applicable:  GENERAL: alert, oriented, appears well and in no acute distress  HEENT: atraumatic, conjunttiva clear, no obvious abnormalities on inspection of external nose and ears  NECK: normal movements of the head and neck  LUNGS: on inspection no signs of respiratory distress, breathing rate appears normal, no obvious gross SOB, gasping or wheezing  CV: no obvious cyanosis  MS: moves all visible extremities without noticeable abnormality, left great toe more swollen than right  PSYCH/NEURO: pleasant and cooperative, no obvious depression or anxiety, speech and thought processing grossly intact  ASSESSMENT AND PLAN:  Discussed the following assessment and plan:  1. Essential hypertension-see below rec changes to BP meds for  benefit of gout and cont norvasc 10 mg for now  2. Gout, left great toe- Plan: colchicine 0.6 MG tablet, predniSONE (DELTASONE) 20 MG tablet  -steroid taper with prn colchicine for now  -consider allopurinol px in future but not in acute gout flare can disc with PCP -consider get comp labs due for the year CMET, CBC, lipid, TSH,UA, uric acid, vitamin D,  PSA via [facility] -will have PCP order for future and he needs to sch f/u with PCP and for fasting labs for biometrics screening for work labs must be with [facility] -will not check uric acid during acute gout flare -consider d/c hctz 12.5 and lis 5 mg qd for BP and change to losartan 25 mg qd  which can reduce uric acid levels with h/o gout  -call back if not better   CC to PCP      I discussed the assessment and treatment plan with the patient. The patient was provided an opportunity to ask questions and all were answered. The patient agreed with the plan and demonstrated an understanding of the instructions.   The patient was advised to call back or seek an in-person evaluation if the symptoms worsen or if the condition fails to improve as anticipated.  Time 15 minutes   Delorise Jackson, MD

## 2018-11-10 ENCOUNTER — Encounter: Payer: Self-pay | Admitting: Family

## 2018-11-14 ENCOUNTER — Other Ambulatory Visit: Payer: Self-pay | Admitting: Family

## 2018-11-14 ENCOUNTER — Telehealth: Payer: Self-pay | Admitting: Family

## 2018-11-14 DIAGNOSIS — I10 Essential (primary) hypertension: Secondary | ICD-10-CM

## 2018-11-14 NOTE — Telephone Encounter (Signed)
Patient is scheduled for lab, but we need to know if he is willing to come off of the HCTZ & lisinopril. These can call flare-up's of gout & he needs to possibly switch to losartan. He will need labs ONE week after starting losartan. I left detailed message for patient to call us back & let us know.   [facility] may speak with patient & let us know his thoughts. Labs need to scheduled for ONE week out after starting medication.

## 2018-11-14 NOTE — Telephone Encounter (Signed)
See mychart I sent to patient today re;gout  Please ensure he gets my message.   He needs labs ONE week after starting losartan if he agrees to changing BP regimen. ( see mychart)  He also needs follow appt with me in a week or 2.

## 2018-11-14 NOTE — Telephone Encounter (Signed)
LMTCB

## 2018-11-15 NOTE — Telephone Encounter (Signed)
The appointmet for 11/18/18 is for labs is there a lower cost Gout medication you can prescribe?

## 2018-11-16 ENCOUNTER — Other Ambulatory Visit: Payer: Self-pay | Admitting: Family

## 2018-11-16 ENCOUNTER — Telehealth: Payer: Self-pay | Admitting: Family

## 2018-11-16 DIAGNOSIS — I10 Essential (primary) hypertension: Secondary | ICD-10-CM

## 2018-11-16 MED ORDER — AMLODIPINE BESYLATE 10 MG PO TABS: 10.0000 mg | ORAL_TABLET | Freq: Every day | ORAL | 4 refills | Status: DC

## 2018-11-16 MED ORDER — INDOMETHACIN 50 MG PO CAPS: 50.0000 mg | ORAL_CAPSULE | Freq: Three times a day (TID) | ORAL | 1 refills | Status: DC

## 2018-11-16 MED ORDER — LOSARTAN POTASSIUM 25 MG PO TABS: 25.0000 mg | ORAL_TABLET | Freq: Every day | ORAL | 1 refills | Status: DC

## 2018-11-16 NOTE — Telephone Encounter (Signed)
Call pt  I sent in indomethacin which is an acute gout treatment drug  If his symptoms are resolved on prednisone, he DOES NOT NEED TO TAKE.   Hope this is cheaper than colchicine. Let us know.

## 2018-11-16 NOTE — Telephone Encounter (Signed)
Call pt's pharmacy first-  Why is colchicine so expensive? Will you look into this.  Also, see mychart message  Review with him and ensure he understands.  Also, he needs appt with me in about a week   Tony Scott,  The colchine is generic and an old drug which we have used for gout flares for some time... I cannot believe so expensive. I'll have Sarah call in regard to this.   I STOPPED lisinopril, hctz.   I sent in losartan for you to start. Please stay on amlodipine as well.   Please keep ensure you have a lab appointment scheduled for ONE week after starting lorsartan. I would also like you to have an appointment ( virtual) with me at that time to check on blood pressure.   I'll have sarah call you again today.  Hope otherwise you are doing well.

## 2018-11-16 NOTE — Telephone Encounter (Signed)
noted 

## 2018-11-18 ENCOUNTER — Other Ambulatory Visit: Payer: Managed Care, Other (non HMO)

## 2018-11-25 ENCOUNTER — Other Ambulatory Visit (INDEPENDENT_AMBULATORY_CARE_PROVIDER_SITE_OTHER): Payer: Managed Care, Other (non HMO)

## 2018-11-25 ENCOUNTER — Encounter: Payer: Self-pay | Admitting: Family

## 2018-11-25 ENCOUNTER — Other Ambulatory Visit: Payer: Self-pay

## 2018-11-25 DIAGNOSIS — I10 Essential (primary) hypertension: Secondary | ICD-10-CM | POA: Diagnosis not present

## 2018-11-25 LAB — URINALYSIS, ROUTINE W REFLEX MICROSCOPIC
Bilirubin Urine: NEGATIVE
Ketones, ur: NEGATIVE
Leukocytes,Ua: NEGATIVE
Nitrite: NEGATIVE
Specific Gravity, Urine: 1.025 (ref 1.000–1.030)
Total Protein, Urine: NEGATIVE
Urine Glucose: NEGATIVE
Urobilinogen, UA: 0.2 (ref 0.0–1.0)
pH: 5 (ref 5.0–8.0)

## 2018-11-26 LAB — LIPID PANEL
Cholesterol: 213 mg/dL — ABNORMAL HIGH (ref <200)
HDL: 48 mg/dL (ref >=40)
LDL Cholesterol (Calc): 147 mg/dL (calc) — ABNORMAL HIGH
Non-HDL Cholesterol (Calc): 165 mg/dL (calc) — ABNORMAL HIGH (ref <130)
Total CHOL/HDL Ratio: 4.4 (calc) (ref <5.0)
Triglycerides: 78 mg/dL (ref <150)

## 2018-11-26 LAB — COMPREHENSIVE METABOLIC PANEL
AG Ratio: 1.8 (calc) (ref 1.0–2.5)
ALT: 21 U/L (ref 9–46)
AST: 16 U/L (ref 10–35)
Albumin: 4.3 g/dL (ref 3.6–5.1)
Alkaline phosphatase (APISO): 52 U/L (ref 35–144)
BUN: 21 mg/dL (ref 7–25)
CO2: 28 mmol/L (ref 20–32)
Calcium: 9.5 mg/dL (ref 8.6–10.3)
Chloride: 105 mmol/L (ref 98–110)
Creat: 0.97 mg/dL (ref 0.70–1.33)
Globulin: 2.4 g/dL (calc) (ref 1.9–3.7)
Glucose, Bld: 104 mg/dL — ABNORMAL HIGH (ref 65–99)
Potassium: 5 mmol/L (ref 3.5–5.3)
Sodium: 142 mmol/L (ref 135–146)
Total Bilirubin: 0.3 mg/dL (ref 0.2–1.2)
Total Protein: 6.7 g/dL (ref 6.1–8.1)

## 2018-11-26 LAB — CBC WITH DIFFERENTIAL/PLATELET
Absolute Monocytes: 470 cells/uL (ref 200–950)
Basophils Absolute: 31 cells/uL (ref 0–200)
Basophils Relative: 0.5 %
Eosinophils Absolute: 372 cells/uL (ref 15–500)
Eosinophils Relative: 6.1 %
HCT: 43.2 % (ref 38.5–50.0)
Hemoglobin: 14.6 g/dL (ref 13.2–17.1)
Lymphs Abs: 1318 cells/uL (ref 850–3900)
MCH: 29.6 pg (ref 27.0–33.0)
MCHC: 33.8 g/dL (ref 32.0–36.0)
MCV: 87.4 fL (ref 80.0–100.0)
MPV: 11.2 fL (ref 7.5–12.5)
Monocytes Relative: 7.7 %
Neutro Abs: 3910 cells/uL (ref 1500–7800)
Neutrophils Relative %: 64.1 %
Platelets: 235 10*3/uL (ref 140–400)
RBC: 4.94 10*6/uL (ref 4.20–5.80)
RDW: 12.4 % (ref 11.0–15.0)
Total Lymphocyte: 21.6 %
WBC: 6.1 10*3/uL (ref 3.8–10.8)

## 2018-11-26 LAB — HEMOGLOBIN A1C
Hgb A1c MFr Bld: 5.7 % of total Hgb — ABNORMAL HIGH (ref <5.7)
Mean Plasma Glucose: 117 (calc)
eAG (mmol/L): 6.5 (calc)

## 2018-11-26 LAB — URIC ACID: Uric Acid, Serum: 7.4 mg/dL (ref 4.0–8.0)

## 2018-11-26 LAB — PSA: PSA: 2.9 ng/mL (ref <=4.0)

## 2018-11-26 LAB — VITAMIN D 25 HYDROXY (VIT D DEFICIENCY, FRACTURES): Vit D, 25-Hydroxy: 23 ng/mL — ABNORMAL LOW (ref 30–100)

## 2018-11-26 LAB — TSH: TSH: 1.59 mIU/L (ref 0.40–4.50)

## 2018-11-28 ENCOUNTER — Ambulatory Visit (INDEPENDENT_AMBULATORY_CARE_PROVIDER_SITE_OTHER): Payer: Managed Care, Other (non HMO) | Admitting: Family

## 2018-11-28 ENCOUNTER — Encounter: Payer: Self-pay | Admitting: Family

## 2018-11-28 DIAGNOSIS — I10 Essential (primary) hypertension: Secondary | ICD-10-CM | POA: Diagnosis not present

## 2018-11-28 DIAGNOSIS — E785 Hyperlipidemia, unspecified: Secondary | ICD-10-CM

## 2018-11-28 DIAGNOSIS — M109 Gout, unspecified: Secondary | ICD-10-CM

## 2018-11-28 MED ORDER — TELMISARTAN 20 MG PO TABS: 20.0000 mg | ORAL_TABLET | Freq: Every day | ORAL | 1 refills | Status: DC

## 2018-11-28 NOTE — Progress Notes (Signed)
This visit type was conducted due to national recommendations for restrictions regarding the COVID-19 pandemic (e.g. social distancing).  This format is felt to be most appropriate for this patient at this time.  All issues noted in this document were discussed and addressed.  No physical exam was performed (except for noted visual exam findings with Video Visits). Virtual Visit via Video Note  I connected with@  on 11/28/18 at  2:00 PM EDT by a video enabled telemedicine application and verified that I am speaking with the correct person using two identifiers.  Location patient: home Location provider:work Persons participating in the virtual visit: patient, provider  I discussed the limitations of evaluation and management by telemedicine and the availability of in person appointments. The patient expressed understanding and agreed to proceed.  Interactive audio and video telecommunications were attempted between this provider and patient, however failed, due to patient having technical difficulties or patient did not have access to video capability.  We continued and completed visit with audio only.     HPI:  Feels well. No new complaints.   Left great toe- 'flare has resolved.' 8-10 flares in total.   indomethacin has helped somewhat over the past month however not completely resolved. Continues to have pain, swelling which has lessened.  No fever, left calf swelling, sob.   Completed prednisone which offered a lot of relief.   Colchicine to expensive.   Working on diet, cannot trigger.   HLD- declines cardiology referral.  HTN- stopped lisinopril 2 weeks ago. Still on hctz. Doing well on amlodipine.     Doesn't like losartan and feels 'hung over' on medication, therefore he has stopped.    ROS: See pertinent positives and negatives per HPI.  Past Medical History:  Diagnosis Date  . Allergy    Hay fever  . Anal fissure   . Arthritis   . Back pain, chronic    abnormality L4-5, per pt  . Blood in stool   . Colon polyp   . Kidney stones     Past Surgical History:  Procedure Laterality Date  . NOSE SURGERY      Family History  Problem Relation Age of Onset  . Colon cancer Mother   . Hypertension Mother   . Hyperlipidemia Father   . Heart disease Father   . Hypertension Father   . Diabetes Father   . Rheum arthritis Sister   . Arthritis Maternal Grandmother   . Hyperlipidemia Maternal Grandmother   . Hypertension Maternal Grandmother   . Heart disease Paternal Grandfather     SOCIAL HX: never smoker.    Current Outpatient Medications:  .  amLODipine (NORVASC) 10 MG tablet, Take 1 tablet (10 mg total) by mouth daily., Disp: 90 tablet, Rfl: 4 .  loratadine (CLARITIN) 10 MG tablet, TAKE 1 TABLET DAILY (Patient not taking: Reported on 11/28/2018), Disp: 90 tablet, Rfl: 3 .  telmisartan (MICARDIS) 20 MG tablet, Take 1 tablet (20 mg total) by mouth daily., Disp: 90 tablet, Rfl: 1   ASSESSMENT AND PLAN:  Discussed the following assessment and plan:  Essential hypertension - Plan: telmisartan (MICARDIS) 20 MG tablet, Basic metabolic panel  Gout, unspecified cause, unspecified chronicity, unspecified site  Hyperlipidemia, unspecified hyperlipidemia type  Problem List Items Addressed This Visit      Cardiovascular and Mediastinum   HTN (hypertension) - Primary    Unsure if controlled. Patient to purchase BP cuff. Reviewed medications, patient had still be on HCTZ and had stopped losartan. He will  start telmisartan . Labs in one week. He will let me know how he is doing.      Relevant Medications   telmisartan (MICARDIS) 20 MG tablet   Other Relevant Orders   Basic metabolic panel     Other   Gout    Improved per patient. Uric acid 7.4. Goal of 6. If no improvement with dc HCTZ, will start allopurinol 100mg  if not having acute flare. He will let me know how he is doing.      HLD (hyperlipidemia)    Discussed CVD,  particularly in regard to father's history. Declines cardiology consult for now. He will let me know.       Relevant Medications   telmisartan (MICARDIS) 20 MG tablet        I discussed the assessment and treatment plan with the patient. The patient was provided an opportunity to ask questions and all were answered. The patient agreed with the plan and demonstrated an understanding of the instructions.   The patient was advised to call back or seek an in-person evaluation if the symptoms worsen or if the condition fails to improve as anticipated.   Mable Paris, FNP

## 2018-11-28 NOTE — Progress Notes (Signed)
Patient scheduled labs for next Tuesday.

## 2018-11-28 NOTE — Assessment & Plan Note (Addendum)
Improved per patient. Uric acid 7.4. Goal of 6. If no improvement with dc HCTZ, will start allopurinol 100mg  if not having acute flare. He will let me know how he is doing.

## 2018-11-28 NOTE — Patient Instructions (Addendum)
You should be on amlodipine 10mg  once daily, telmisartan 25mg  ( this is new medication).   You should be OFF lisinopril and hydrocholoride ( Microzide) and losartan .   Please purchase blood cuff.   Monitor blood pressure,  Goal is less than 120/80, based on newest guidelines; if persistently higher, please make sooner follow up appointment so we can recheck you blood pressure and manage medications.   Labs in one week.   We will evaluate gout after medication changes to see if allopurinol is needed  Let me know about cardiology referral in the future.

## 2018-11-28 NOTE — Assessment & Plan Note (Signed)
Unsure if controlled. Patient to purchase BP cuff. Reviewed medications, patient had still be on HCTZ and had stopped losartan. He will start telmisartan . Labs in one week. He will let me know how he is doing.

## 2018-11-28 NOTE — Assessment & Plan Note (Addendum)
Discussed CVD, particularly in regard to father's history. Declines cardiology consult for now. He will let me know.

## 2018-11-30 ENCOUNTER — Encounter: Payer: Self-pay | Admitting: Family

## 2018-12-02 ENCOUNTER — Ambulatory Visit: Payer: Managed Care, Other (non HMO) | Admitting: Family

## 2018-12-06 ENCOUNTER — Encounter: Payer: Self-pay | Admitting: Family

## 2018-12-06 ENCOUNTER — Other Ambulatory Visit: Payer: Self-pay

## 2018-12-06 ENCOUNTER — Other Ambulatory Visit (INDEPENDENT_AMBULATORY_CARE_PROVIDER_SITE_OTHER): Payer: Managed Care, Other (non HMO)

## 2018-12-06 DIAGNOSIS — I10 Essential (primary) hypertension: Secondary | ICD-10-CM | POA: Diagnosis not present

## 2018-12-06 LAB — BASIC METABOLIC PANEL
BUN: 24 mg/dL — ABNORMAL HIGH (ref 6–23)
CO2: 30 mEq/L (ref 19–32)
Calcium: 9.5 mg/dL (ref 8.4–10.5)
Chloride: 103 mEq/L (ref 96–112)
Creatinine, Ser: 1.08 mg/dL (ref 0.40–1.50)
GFR: 70.63 mL/min (ref >60.00)
Glucose, Bld: 99 mg/dL (ref 70–99)
Potassium: 5.5 mEq/L — ABNORMAL HIGH (ref 3.5–5.1)
Sodium: 140 mEq/L (ref 135–145)

## 2018-12-07 ENCOUNTER — Telehealth: Payer: Self-pay | Admitting: Family

## 2018-12-07 DIAGNOSIS — I10 Essential (primary) hypertension: Secondary | ICD-10-CM

## 2018-12-07 NOTE — Telephone Encounter (Signed)
Noted He is off telmisartan

## 2018-12-07 NOTE — Telephone Encounter (Signed)
Called pt Left vm to call office; make sure I speak with him  Need to speak with patient in regard to high potassium

## 2018-12-07 NOTE — Telephone Encounter (Signed)
Call patient I do not believe he call the office this morning.  First, to guide therapy , we need to know his blood pressure and heart rate.  Can you these readings? If patient does not have reliable cuff, please make nurse visit, can accommodate this ASAP?  Also need to repeat his blood work -  if we can do a BMP when he comes in to check his blood pressure, that would makes less trips for him.     In the interim I advised him to STOP the telmisartan altogether as concerned it has elevated his potassium.  Based on his blood pressure readings, heart rate we will decide on what medication we add on from there.  He is to stay on the amlodipine.  Please ensure he understands all.

## 2018-12-07 NOTE — Telephone Encounter (Signed)
LM to call back ASAP

## 2018-12-08 ENCOUNTER — Other Ambulatory Visit: Payer: Self-pay

## 2018-12-08 ENCOUNTER — Ambulatory Visit (INDEPENDENT_AMBULATORY_CARE_PROVIDER_SITE_OTHER): Payer: Managed Care, Other (non HMO)

## 2018-12-08 ENCOUNTER — Other Ambulatory Visit (INDEPENDENT_AMBULATORY_CARE_PROVIDER_SITE_OTHER): Payer: Managed Care, Other (non HMO)

## 2018-12-08 VITALS — BP 142/90 | HR 73

## 2018-12-08 DIAGNOSIS — I10 Essential (primary) hypertension: Secondary | ICD-10-CM | POA: Diagnosis not present

## 2018-12-08 LAB — BASIC METABOLIC PANEL
BUN: 22 mg/dL (ref 6–23)
CO2: 29 mEq/L (ref 19–32)
Calcium: 9.6 mg/dL (ref 8.4–10.5)
Chloride: 103 mEq/L (ref 96–112)
Creatinine, Ser: 1.03 mg/dL (ref 0.40–1.50)
GFR: 74.6 mL/min (ref >60.00)
Glucose, Bld: 96 mg/dL (ref 70–99)
Potassium: 5 mEq/L (ref 3.5–5.1)
Sodium: 141 mEq/L (ref 135–145)

## 2018-12-08 NOTE — Progress Notes (Signed)
BP is slightly on higher end today. Patient not having any acute symptoms. He was sent home after BP/Pulse check.  Will route to PCP for any medication changes.   Philis Nettle FNP

## 2018-12-08 NOTE — Progress Notes (Signed)
Patient presented for nurse visit BP check per order from 11/28/18.   Patient reports compliance with prescribed BP medications: YES   Last dose of BP medication: 10:30 pm on 12/07/18   BP Readings from Last 3 Encounters:    12/08/18 142/90  03/21/18 120/70  12/13/17 134/74       Pulse Readings from Last 3 Encounters:     12/08/18 72  03/21/18 66  12/13/17 62

## 2018-12-09 ENCOUNTER — Other Ambulatory Visit (INDEPENDENT_AMBULATORY_CARE_PROVIDER_SITE_OTHER): Payer: Managed Care, Other (non HMO)

## 2018-12-09 ENCOUNTER — Telehealth: Payer: Self-pay | Admitting: Family

## 2018-12-09 ENCOUNTER — Other Ambulatory Visit: Payer: Self-pay | Admitting: Family

## 2018-12-09 DIAGNOSIS — M109 Gout, unspecified: Secondary | ICD-10-CM

## 2018-12-09 DIAGNOSIS — I10 Essential (primary) hypertension: Secondary | ICD-10-CM

## 2018-12-09 LAB — URIC ACID: Uric Acid, Serum: 8.6 mg/dL — ABNORMAL HIGH (ref 4.0–7.8)

## 2018-12-09 MED ORDER — METOPROLOL SUCCINATE ER 25 MG PO TB24: 25.0000 mg | ORAL_TABLET | Freq: Every day | ORAL | 1 refills | Status: DC

## 2018-12-09 NOTE — Telephone Encounter (Signed)
Reviewed BP, HR from yesterday  12/08/18 142/90  03/21/18 120/70  12/13/17 134/74      Pulse Readings from Last 3 Encounters:     12/08/18 72  03/21/18 66  12/13/17 62

## 2019-01-03 ENCOUNTER — Other Ambulatory Visit: Payer: Self-pay

## 2019-01-03 DIAGNOSIS — I10 Essential (primary) hypertension: Secondary | ICD-10-CM

## 2019-01-03 MED ORDER — METOPROLOL SUCCINATE ER 25 MG PO TB24: 25.0000 mg | ORAL_TABLET | Freq: Every day | ORAL | 3 refills | Status: DC

## 2019-01-11 ENCOUNTER — Telehealth: Payer: Self-pay | Admitting: Family

## 2019-01-11 NOTE — Telephone Encounter (Signed)
Call pt I wanted to circle back with patient  How is his blood pressure on toprol?  Also, please review mychart note from 12/09/18.  Does he want to start allopurinol for elevated uric acid?   Also, would he do a renal artery [place]?  This is part of work up for elevated potassium  I will order if so.   Please make f/u doxy if BP not < 120/80.

## 2019-01-12 NOTE — Telephone Encounter (Signed)
LMTCB to see how patient was doing & advise on below.

## 2019-01-16 NOTE — Telephone Encounter (Signed)
Circle back Please call patient to discuss below

## 2019-01-17 NOTE — Telephone Encounter (Signed)
LMTCB to advise on below.

## 2019-02-20 NOTE — Telephone Encounter (Signed)
Call pt again  If you cannot reach, please mail a letter that he is overdue for follow up in the office specifically to monitor blood pressure. I would also like to have discussion of elevated uric acid, and also if we should pursue ultrasound of kidneys after his potassium was quite elevated. Please ask him to make an appt, he may do in person if he doesn't have a BP cuff at home where he can check his BP.

## 2019-02-20 NOTE — Telephone Encounter (Signed)
LM of patient's VM & mailed letter.

## 2019-04-10 ENCOUNTER — Encounter: Payer: Self-pay | Admitting: Family

## 2019-04-11 ENCOUNTER — Telehealth: Payer: Self-pay | Admitting: Family Medicine

## 2019-04-11 NOTE — Telephone Encounter (Signed)
Please call the patient. I have reviewed his form and it looks like we need an up to date height and weight. Please see if he can give this to [place] or if he can come in for these to be checked. Thanks.

## 2019-04-11 NOTE — Telephone Encounter (Signed)
Lm asking patient to call back to give updated height & weight. If not he needs to be scheduled for f/u to come in & have this done.

## 2019-04-13 NOTE — Telephone Encounter (Signed)
Noted. Form completed. I will place on your desk.

## 2019-04-14 NOTE — Telephone Encounter (Signed)
Did you fax this?

## 2019-04-18 NOTE — Telephone Encounter (Signed)
Yes I spoke to Clarise Cruz and I did fax this on 04/14/2019.  Analysia Dungee,cma

## 2019-04-24 ENCOUNTER — Encounter: Payer: Self-pay | Admitting: Family

## 2019-06-10 ENCOUNTER — Encounter: Payer: Self-pay | Admitting: Family

## 2019-06-14 ENCOUNTER — Encounter: Payer: Self-pay | Admitting: Family

## 2019-06-15 ENCOUNTER — Encounter: Payer: Self-pay | Admitting: Family

## 2019-06-15 ENCOUNTER — Other Ambulatory Visit: Payer: Self-pay

## 2019-06-15 ENCOUNTER — Ambulatory Visit (INDEPENDENT_AMBULATORY_CARE_PROVIDER_SITE_OTHER): Payer: Managed Care, Other (non HMO) | Admitting: Internal Medicine

## 2019-06-15 ENCOUNTER — Encounter: Payer: Self-pay | Admitting: Internal Medicine

## 2019-06-15 DIAGNOSIS — I10 Essential (primary) hypertension: Secondary | ICD-10-CM | POA: Diagnosis not present

## 2019-06-15 DIAGNOSIS — M109 Gout, unspecified: Secondary | ICD-10-CM

## 2019-06-15 MED ORDER — COLCHICINE 0.6 MG PO TABS: 0.6000 mg | ORAL_TABLET | Freq: Two times a day (BID) | ORAL | 0 refills | Status: DC | PRN

## 2019-06-15 NOTE — Telephone Encounter (Signed)
Pt scheduled  

## 2019-06-15 NOTE — Progress Notes (Signed)
Patient ID: Tony Scott, male   DOB: Apr 04, 1961, 58 y.o.   MRN: [id number]   Virtual Visit via video Note  This visit type was conducted due to national recommendations for restrictions regarding the COVID-19 pandemic (e.g. social distancing).  This format is felt to be most appropriate for this patient at this time.  All issues noted in this document were discussed and addressed.  No physical exam was performed (except for noted visual exam findings with Video Visits).   I connected with Nicholos Johns by a video enabled telemedicine application and verified that I am speaking with the correct person using two identifiers. Location patient: home Location provider: work  Persons participating in the virtual visit: patient, provider  I discussed the limitations, risks, security and privacy concerns of performing an evaluation and management service by video and the availability of in person appointments.  The patient expressed understanding and agreed to proceed.   Reason for visit: work in appt  HPI: Reports he has a history of gout.  Previously would have a flare every 6 months. Off hctz now.  No recent flares.  States that starting over the last several days, noticed some discomfort.  Worsened.  Right ankle is a swollen. States the two places he has had gout flares are his ankle and his toe.  Feels similar to previous gout flare.  No fever.  Otherwise doing well.  Discussed gout.  Discussed preventative medication - for future.  Discussed acute treatment.  He is off hctz.  Discussed his blood pressure.  He will start spot checking his pressure and send in readings.  Has been taking aspirin and advil.  Denies any injury or trauma.     ROS: See pertinent positives and negatives per HPI.  Past Medical History:  Diagnosis Date  . Allergy    Hay fever  . Anal fissure   . Arthritis   . Back pain, chronic    abnormality L4-5, per pt  . Blood in stool   . Colon polyp   . Kidney stones      Past Surgical History:  Procedure Laterality Date  . NOSE SURGERY      Family History  Problem Relation Age of Onset  . Colon cancer Mother   . Hypertension Mother   . Hyperlipidemia Father   . Heart disease Father   . Hypertension Father   . Diabetes Father   . Rheum arthritis Sister   . Arthritis Maternal Grandmother   . Hyperlipidemia Maternal Grandmother   . Hypertension Maternal Grandmother   . Heart disease Paternal Grandfather     SOCIAL HX: reviewed.    Current Outpatient Medications:  .  amLODipine (NORVASC) 10 MG tablet, Take 1 tablet (10 mg total) by mouth daily., Disp: 90 tablet, Rfl: 4 .  colchicine 0.6 MG tablet, Take 1 tablet (0.6 mg total) by mouth 2 (two) times daily as needed., Disp: 30 tablet, Rfl: 0 .  loratadine (CLARITIN) 10 MG tablet, TAKE 1 TABLET DAILY (Patient not taking: Reported on 11/28/2018), Disp: 90 tablet, Rfl: 3 .  metoprolol succinate (TOPROL-XL) 25 MG 24 hr tablet, Take 1 tablet (25 mg total) by mouth daily., Disp: 90 tablet, Rfl: 3 .  predniSONE (DELTASONE) 10 MG tablet, Take 4 tablets x 1 day and then decrease by 1/2 tablet per day until down to zero mg., Disp: 18 tablet, Rfl: 0  EXAM:  GENERAL: alert, oriented, appears well and in no acute distress  HEENT: atraumatic, conjunttiva clear, no  obvious abnormalities on inspection of external nose and ears  NECK: normal movements of the head and neck  LUNGS: on inspection no signs of respiratory distress, breathing rate appears normal, no obvious gross SOB, gasping or wheezing  CV: no obvious cyanosis  MS: some increased soft tissue swelling - right ankle/top of foot.  No significant increased erythema.  No swelling extending up his leg.    PSYCH/NEURO: pleasant and cooperative, no obvious depression or anxiety, speech and thought processing grossly intact  ASSESSMENT AND PLAN:  Discussed the following assessment and plan:  Gout Recent flare as outlined.  Feels similar to previous  gout flares.  Off hctz.  Treat with colchicine as directed.  Discussed preventative medication - to be started in future.  (not during acute flare).  Follow.  Call with update.    HTN (hypertension) Off hctz.  Off ace inhibitor due to hyperkalemia.  Asked him to spot check his pressure.  Send in readings.  Schedule f/u with Joycelyn Schmid regarding his blood pressure.  Consider addition of hydralazine if needs something more for his blood pressure.      I discussed the assessment and treatment plan with the patient. The patient was provided an opportunity to ask questions and all were answered. The patient agreed with the plan and demonstrated an understanding of the instructions.   The patient was advised to call back or seek an in-person evaluation if the symptoms worsen or if the condition fails to improve as anticipated.   Einar Pheasant, MD

## 2019-06-15 NOTE — Telephone Encounter (Signed)
Pt scheduled as doxy

## 2019-06-15 NOTE — Telephone Encounter (Signed)
Instead of waiting, see if he wants appt today at 1:30.  Thanks.

## 2019-06-16 ENCOUNTER — Ambulatory Visit: Payer: Managed Care, Other (non HMO) | Admitting: Internal Medicine

## 2019-06-16 MED ORDER — PREDNISONE 10 MG PO TABS: ORAL_TABLET | ORAL | 0 refills | Status: DC

## 2019-06-16 NOTE — Telephone Encounter (Signed)
rx sent in for prednisone taper.

## 2019-06-18 ENCOUNTER — Encounter: Payer: Self-pay | Admitting: Internal Medicine

## 2019-06-18 NOTE — Assessment & Plan Note (Signed)
Off hctz.  Off ace inhibitor due to hyperkalemia.  Asked him to spot check his pressure.  Send in readings.  Schedule f/u with Joycelyn Schmid regarding his blood pressure.  Consider addition of hydralazine if needs something more for his blood pressure.

## 2019-06-18 NOTE — Assessment & Plan Note (Signed)
Recent flare as outlined.  Feels similar to previous gout flares.  Off hctz.  Treat with colchicine as directed.  Discussed preventative medication - to be started in future.  (not during acute flare).  Follow.  Call with update.

## 2019-06-19 NOTE — Telephone Encounter (Signed)
Pt was seen on 06/15/2019.

## 2019-07-04 ENCOUNTER — Encounter: Payer: Self-pay | Admitting: Internal Medicine

## 2019-07-04 NOTE — Telephone Encounter (Signed)
Since was just seen 06/15/19 - I would prefer for Tony Scott to be aware of what is going on.  If needs appt - can see if she can work in or see if wants to prescribe refill.  You can let him know she is not in the office this pm, but will be in office tomorrow am.

## 2019-07-05 ENCOUNTER — Other Ambulatory Visit: Payer: Self-pay | Admitting: Family

## 2019-07-05 ENCOUNTER — Encounter: Payer: Self-pay | Admitting: Family

## 2019-07-05 DIAGNOSIS — M109 Gout, unspecified: Secondary | ICD-10-CM

## 2019-07-05 MED ORDER — COLCHICINE 0.6 MG PO TABS: ORAL_TABLET | ORAL | 1 refills | Status: DC

## 2019-07-05 MED ORDER — PREDNISONE 10 MG PO TABS: ORAL_TABLET | ORAL | 0 refills | Status: DC

## 2019-07-19 ENCOUNTER — Ambulatory Visit (INDEPENDENT_AMBULATORY_CARE_PROVIDER_SITE_OTHER): Payer: Managed Care, Other (non HMO) | Admitting: Family

## 2019-07-19 ENCOUNTER — Encounter: Payer: Self-pay | Admitting: Family

## 2019-07-19 ENCOUNTER — Telehealth: Payer: Self-pay | Admitting: Family

## 2019-07-19 VITALS — BP 166/92 | Ht 72.0 in | Wt 227.0 lb

## 2019-07-19 DIAGNOSIS — I10 Essential (primary) hypertension: Secondary | ICD-10-CM | POA: Diagnosis not present

## 2019-07-19 DIAGNOSIS — M109 Gout, unspecified: Secondary | ICD-10-CM | POA: Diagnosis not present

## 2019-07-19 MED ORDER — PREDNISONE 10 MG PO TABS: ORAL_TABLET | ORAL | 0 refills | Status: DC

## 2019-07-19 MED ORDER — HYDRALAZINE HCL 10 MG PO TABS: 5.0000 mg | ORAL_TABLET | Freq: Three times a day (TID) | ORAL | 1 refills | Status: DC

## 2019-07-19 MED ORDER — CARVEDILOL 6.25 MG PO TABS: 6.2500 mg | ORAL_TABLET | Freq: Two times a day (BID) | ORAL | 3 refills | Status: DC

## 2019-07-19 NOTE — Progress Notes (Signed)
Virtual Visit via Video Note  I connected with@  on 07/19/19 at 11:30 AM EST by a video enabled telemedicine application and verified that I am speaking with the correct person using two identifiers.  Location patient: home Location provider:work  Persons participating in the virtual visit: patient, provider  I discussed the limitations of evaluation and management by telemedicine and the availability of in person appointments. The patient expressed understanding and agreed to proceed.  Interactive audio and video telecommunications were attempted between this provider and patient, however failed however only at the VERY end of video due to patient having technical difficulties or patient did not have access to video capability.  We continued and completed visit with audio only.   HPI:  HTN-At home is 166/100, HR 77.  at home values range from 148/95, 160/114, 157/94, 145/92, 133/99, 156/93.  Takes BP in the evening. No CP, SOB   Gout- 2 episodes over the past year. right ankle pain resolved. In the past, gout flare had been in left big toe. uses colchichine for acute flare. Prednisone improves pain within hours and would like to have spare prednisone in medicine cabinet. Nothing in diet which is trigger. No shell fish. Limits red meat.   No injury, breaks in the skin.    ROS: See pertinent positives and negatives per HPI.  Past Medical History:  Diagnosis Date  . Allergy    Hay fever  . Anal fissure   . Arthritis   . Back pain, chronic    abnormality L4-5, per pt  . Blood in stool   . Colon polyp   . Kidney stones     Past Surgical History:  Procedure Laterality Date  . NOSE SURGERY      Family History  Problem Relation Age of Onset  . Colon cancer Mother   . Hypertension Mother   . Hyperlipidemia Father   . Heart disease Father   . Hypertension Father   . Diabetes Father   . Rheum arthritis Sister   . Arthritis Maternal Grandmother   . Hyperlipidemia Maternal  Grandmother   . Hypertension Maternal Grandmother   . Heart disease Paternal Grandfather     SOCIAL HX: never smoker   Current Outpatient Medications:  .  amLODipine (NORVASC) 10 MG tablet, Take 1 tablet (10 mg total) by mouth daily., Disp: 90 tablet, Rfl: 4 .  loratadine (CLARITIN) 10 MG tablet, TAKE 1 TABLET DAILY, Disp: 90 tablet, Rfl: 3 .  metoprolol succinate (TOPROL-XL) 25 MG 24 hr tablet, Take 1 tablet (25 mg total) by mouth daily., Disp: 90 tablet, Rfl: 3 .  carvedilol (COREG) 6.25 MG tablet, Take 1 tablet (6.25 mg total) by mouth 2 (two) times daily with a meal., Disp: 60 tablet, Rfl: 3 .  predniSONE (DELTASONE) 10 MG tablet, Take 40 mg by mouth on day 1, then taper 10 mg daily until gone, Disp: 10 tablet, Rfl: 0  EXAM:  VITALS per patient if applicable: Vitals:   99991111 1141 07/19/19 1144  BP: 110/76 (!) 166/92     GENERAL: alert, oriented, appears well and in no acute distress  HEENT: atraumatic, conjunttiva clear, no obvious abnormalities on inspection of external nose and ears  NECK: normal movements of the head and neck  LUNGS: on inspection no signs of respiratory distress, breathing rate appears normal, no obvious gross SOB, gasping or wheezing  CV: no obvious cyanosis  MS: moves all visible extremities without noticeable abnormality  PSYCH/NEURO: pleasant and cooperative, no obvious depression  or anxiety, speech and thought processing grossly intact  ASSESSMENT AND PLAN:  Discussed the following assessment and plan:  Essential hypertension - Plan: Comprehensive metabolic panel-FUTURE, carvedilol (COREG) 6.25 MG tablet, DISCONTINUED: hydrALAZINE (APRESOLINE) 10 MG tablet  Gout, unspecified cause, unspecified chronicity, unspecified site - Plan: Uric acid, predniSONE (DELTASONE) 10 MG tablet Problem List Items Addressed This Visit      Cardiovascular and Mediastinum   HTN (hypertension) - Primary    Uncontrolled.discussed hydralazine, since I have  discontinued.   After consulting with Catie our pharmacist, we jointly agreed that switching from Toprol to [place] may be more beneficial. We will also we will also look for reasons for resistant hypertension.  Advised patient to have renal ultrasound and sleep study.  I sent a MyChart message in regards to this and will await to hear from him. Close f/u.      Relevant Medications   carvedilol (COREG) 6.25 MG tablet   Other Relevant Orders   Comprehensive metabolic panel-FUTURE     Other   Gout    Discussed gout prevention, diet. Sent in prednisone with caveat that patient must call office when he has suspected flare. Out of compassion and rapid onset of pain, I agree that having at home may relieve burden of going to pharmacy etc during acute flare. He understand to call the office and pay close attention to BP while on prednisone.  Discussed allopurinol; will await uric acid. Close f/u.      Relevant Medications   predniSONE (DELTASONE) 10 MG tablet   Other Relevant Orders   Uric acid      -we discussed possible serious and likely etiologies, options for evaluation and workup, limitations of telemedicine visit vs in person visit, treatment, treatment risks and precautions. Pt prefers to treat via telemedicine empirically rather then risking or undertaking an in person visit at this moment. Patient agrees to seek prompt in person care if worsening, new symptoms arise, or if is not improving with treatment.   I discussed the assessment and treatment plan with the patient. The patient was provided an opportunity to ask questions and all were answered. The patient agreed with the plan and demonstrated an understanding of the instructions.   The patient was advised to call back or seek an in-person evaluation if the symptoms worsen or if the condition fails to improve as anticipated.   Mable Paris, FNP  I spent 25 min non face to face w/ pt.

## 2019-07-19 NOTE — Telephone Encounter (Signed)
I have called [facility] to cancel prescription of hydralzine.

## 2019-07-19 NOTE — Telephone Encounter (Signed)
I left a message for patient to call us back.  Also sent a detailed my chart message in regard to medication changes to patient.  If you please call patient's [facility] today and asked them to not fill the hydralazine.  I will cancel that order.  I decided discontinue the Toprol and start patient on carvedilol.

## 2019-07-19 NOTE — Assessment & Plan Note (Addendum)
Discussed gout prevention, diet. Sent in prednisone with caveat that patient must call office when he has suspected flare. Out of compassion and rapid onset of pain, I agree that having at home may relieve burden of going to pharmacy etc during acute flare. He understand to call the office and pay close attention to BP while on prednisone.  Discussed allopurinol; will await uric acid. Close f/u.

## 2019-07-19 NOTE — Assessment & Plan Note (Addendum)
Uncontrolled.discussed hydralazine, since I have discontinued.   After consulting with Catie our pharmacist, we jointly agreed that switching from Toprol to [place] may be more beneficial. We will also we will also look for reasons for resistant hypertension.  Advised patient to have renal ultrasound and sleep study.  I sent a MyChart message in regards to this and will await to hear from him. Close f/u.

## 2019-07-24 ENCOUNTER — Telehealth: Payer: Self-pay

## 2019-07-24 NOTE — Addendum Note (Signed)
Addended by: Burnard Hawthorne on: 07/24/2019 04:17 PM   Modules accepted: Orders

## 2019-07-24 NOTE — Telephone Encounter (Signed)
FYI brock, Careers information officer with patient today  Notes had been taking 400mg  ibuprofen per day. Since stopped unless he plays golf.   Feels well today. No CP, SOB, palpitations.   Has been hydralazine TID , BP yesterday  162/109 HR 93, yesterday.Today 169/116, HR 71.    Still on amlodipine, metoprolol.  Now taking carvedilol.   Today has taken half pill of hydralazine today, taking one pill of carvedilol this morning.   Hasnt taken metoprolol nor amlodipine today  Advised other dose of the carvedilol and take amlodipine this evening.  He will stop hydralazine.  Reviewed all of this extensively with patient and we are on the same page.  He declines US renal for now, we will discuss at f/u.  Doesn't think he has sleep apnea.   Counseled on signs and symptoms in regards to go to ED-lightheadedness, dizziness, cp, sob.  He verbalized understanding of all

## 2019-07-24 NOTE — Telephone Encounter (Signed)
Patient is very upset that he started a medication last week and then he was told to d/c.He is wanting to discuss with PCP. Patient was informed of the message.

## 2019-07-24 NOTE — Telephone Encounter (Signed)
Left message for patient to return call back.  he hasnt read mychart message in regards to changing BP regimen from last week.  I want him to be clear:  start coreg  STOP toprol we also discontinued the hydralazine which we had discussed during our VV 07/19/19.   I get so nervous wiht him and he is hard to reach

## 2019-07-24 NOTE — Telephone Encounter (Signed)
Tony Scott  Would you try to reach this patient again?  he hasnt read mychart message in regards to changing BP regimen from last week.  I want him to be clear:  start coreg  STOP toprol we also discontinued the hydralazine which we had discussed during our VV 07/19/19.   I get so nervous wiht him and he is hard to reach

## 2019-07-25 ENCOUNTER — Other Ambulatory Visit: Payer: Self-pay

## 2019-07-25 ENCOUNTER — Other Ambulatory Visit (INDEPENDENT_AMBULATORY_CARE_PROVIDER_SITE_OTHER): Payer: Managed Care, Other (non HMO)

## 2019-07-25 DIAGNOSIS — I10 Essential (primary) hypertension: Secondary | ICD-10-CM

## 2019-07-25 DIAGNOSIS — M109 Gout, unspecified: Secondary | ICD-10-CM

## 2019-07-25 LAB — COMPREHENSIVE METABOLIC PANEL
ALT: 43 U/L (ref 0–53)
AST: 23 U/L (ref 0–37)
Albumin: 4.7 g/dL (ref 3.5–5.2)
Alkaline Phosphatase: 58 U/L (ref 39–117)
BUN: 17 mg/dL (ref 6–23)
CO2: 29 mEq/L (ref 19–32)
Calcium: 9.8 mg/dL (ref 8.4–10.5)
Chloride: 102 mEq/L (ref 96–112)
Creatinine, Ser: 1.11 mg/dL (ref 0.40–1.50)
GFR: 68.27 mL/min (ref >60.00)
Glucose, Bld: 107 mg/dL — ABNORMAL HIGH (ref 70–99)
Potassium: 4.8 mEq/L (ref 3.5–5.1)
Sodium: 139 mEq/L (ref 135–145)
Total Bilirubin: 0.8 mg/dL (ref 0.2–1.2)
Total Protein: 7.7 g/dL (ref 6.0–8.3)

## 2019-07-25 LAB — URIC ACID: Uric Acid, Serum: 9.2 mg/dL — ABNORMAL HIGH (ref 4.0–7.8)

## 2019-07-26 ENCOUNTER — Other Ambulatory Visit: Payer: Self-pay | Admitting: Family

## 2019-07-26 DIAGNOSIS — I10 Essential (primary) hypertension: Secondary | ICD-10-CM

## 2019-07-27 ENCOUNTER — Other Ambulatory Visit: Payer: Self-pay

## 2019-07-27 MED ORDER — ALLOPURINOL 100 MG PO TABS: 100.0000 mg | ORAL_TABLET | Freq: Every day | ORAL | 0 refills | Status: DC

## 2019-08-01 ENCOUNTER — Encounter: Payer: Self-pay | Admitting: Family

## 2019-08-02 ENCOUNTER — Ambulatory Visit: Payer: Managed Care, Other (non HMO) | Admitting: Family

## 2019-08-04 ENCOUNTER — Other Ambulatory Visit: Payer: Self-pay

## 2019-08-04 ENCOUNTER — Ambulatory Visit (INDEPENDENT_AMBULATORY_CARE_PROVIDER_SITE_OTHER): Payer: Managed Care, Other (non HMO) | Admitting: Family

## 2019-08-04 ENCOUNTER — Encounter: Payer: Self-pay | Admitting: Family

## 2019-08-04 VITALS — BP 142/85 | HR 69 | Temp 96.7°F | Ht 72.0 in | Wt 229.0 lb

## 2019-08-04 DIAGNOSIS — I10 Essential (primary) hypertension: Secondary | ICD-10-CM

## 2019-08-04 DIAGNOSIS — M109 Gout, unspecified: Secondary | ICD-10-CM

## 2019-08-04 NOTE — Progress Notes (Signed)
Subjective:    Patient ID: Tony Scott, male    DOB: [birth year], 59 y.o.   MRN: [id number]  CC: Tony Scott is a 59 y.o. male who presents today for follow up.   HPI: Follow-up hypertension.  Some improvement of blood pressure on coreg.  143/109 with heart rate 99 yesterday. Denies exertional chest pain or pressure, numbness or tingling radiating to left arm or jaw, palpitations, dizziness, frequent headaches, changes in vision, or shortness of breath.  On amlodipine 10mg , carvedilol 6.25 qpm.   Gout Prophylaxis-started allopurinol one week ago; he is not on the prednisone at this time He notes that about 18 hours ago he felt a slight pain in his right medial ankle where he has typically experienced gout.  There is no increased warmth or redness laceration.  No reports of injury.  He is curious as to whether this will become an actual gout flare.  He had a prednisone pack at home that works acutely and well for him    HISTORY:  Past Medical History:  Diagnosis Date  . Allergy    Hay fever  . Anal fissure   . Arthritis   . Back pain, chronic    abnormality L4-5, per pt  . Blood in stool   . Colon polyp   . Kidney stones    Past Surgical History:  Procedure Laterality Date  . NOSE SURGERY     Family History  Problem Relation Age of Onset  . Colon cancer Mother   . Hypertension Mother   . Hyperlipidemia Father   . Heart disease Father   . Hypertension Father   . Diabetes Father   . Rheum arthritis Sister   . Arthritis Maternal Grandmother   . Hyperlipidemia Maternal Grandmother   . Hypertension Maternal Grandmother   . Heart disease Paternal Grandfather     Allergies: Patient has no known allergies. Current Outpatient Medications on File Prior to Visit  Medication Sig Dispense Refill  . allopurinol (ZYLOPRIM) 100 MG tablet Take 1 tablet (100 mg total) by mouth daily. 90 tablet 0  . amLODipine (NORVASC) 10 MG tablet TAKE 1 TABLET DAILY 90 tablet 3  . carvedilol  (COREG) 6.25 MG tablet Take 1 tablet (6.25 mg total) by mouth 2 (two) times daily with a meal. 60 tablet 3  . loratadine (CLARITIN) 10 MG tablet TAKE 1 TABLET DAILY 90 tablet 3  . [DISCONTINUED] colchicine 0.6 MG tablet 1.2mg  PO x1, then 0.6mg  PO 1hr x 1.Do not repeat x 3 days. (Patient not taking: Reported on 07/19/2019) 30 tablet 1   No current facility-administered medications on file prior to visit.    Social History   Tobacco Use  . Smoking status: Never Smoker  . Smokeless tobacco: Never Used  Substance Use Topics  . Alcohol use: Yes    Alcohol/week: 8.0 standard drinks    Types: 8 drink(s) per week    Comment: social  . Drug use: No    Review of Systems  Constitutional: Negative for chills and fever.  HENT: Negative for congestion, ear pain, rhinorrhea, sinus pressure and sore throat.   Respiratory: Negative for cough, shortness of breath and wheezing.   Cardiovascular: Negative for chest pain and palpitations.  Gastrointestinal: Negative for diarrhea, nausea and vomiting.  Genitourinary: Negative for dysuria.  Musculoskeletal: Positive for arthralgias. Negative for joint swelling and myalgias.  Skin: Negative for rash.  Neurological: Negative for headaches.  Hematological: Negative for adenopathy.      Objective:  BP (!) 142/85 Comment: left arm  Pulse 69   Temp (!) 96.7 F (35.9 C) (Temporal)   Ht 6' (1.829 m)   Wt 229 lb (103.9 kg)   SpO2 96%   BMI 31.06 kg/m  BP Readings from Last 3 Encounters:  08/04/19 (!) 142/85  07/19/19 (!) 166/92  12/08/18 (!) 142/90   Wt Readings from Last 3 Encounters:  08/04/19 229 lb (103.9 kg)  07/19/19 227 lb (103 kg)  03/21/18 216 lb 6 oz (98.1 kg)    Physical Exam Vitals reviewed.  Constitutional:      Appearance: He is well-developed.  Cardiovascular:     Rate and Rhythm: Regular rhythm.     Heart sounds: Normal heart sounds.  Pulmonary:     Effort: Pulmonary effort is normal. No respiratory distress.      Breath sounds: Normal breath sounds. No wheezing, rhonchi or rales.  Musculoskeletal:       Legs:     Comments: Medial right ankle pain as described by patient.  No edema, erythema or tenderness to palpation appreciated on exam.  Skin:    General: Skin is warm and dry.  Neurological:     Mental Status: He is alert.  Psychiatric:        Speech: Speech normal.        Behavior: Behavior normal.        Assessment & Plan:   Problem List Items Addressed This Visit      Cardiovascular and Mediastinum   HTN (hypertension)    Very pleased as BP is improving very well taken by me after resting exam room 142/85.  The patient is diabetic and Coreg twice daily, have advised him to start doing this and to let me know blood pressures do not reach goal after week of therapy        Other   Gout - Primary    Mild symptom per patient. No tenderness on exam. ?  Whether this is the beginning of a gout flare.  Right ankle is where he does have recurrent episodes of gout.  started on allopurinol discussed with him the goal of uric acid to be less than 7.  Consulted with Catie PharmD and safe to start on allopurinol during acute flare.   Advised if pain were to increase, he may start prednisone and he will let me know. Declines XR of ankle today.Will recheck uric acid in a month.      Relevant Orders   Uric acid       I have discontinued Cecilie Lowers L. Larsson's predniSONE. I am also having him maintain his loratadine, carvedilol, amLODipine, and allopurinol.   No orders of the defined types were placed in this encounter.   Return precautions given.   Risks, benefits, and alternatives of the medications and treatment plan prescribed today were discussed, and patient expressed understanding.   Education regarding symptom management and diagnosis given to patient on AVS.  Continue to follow with Burnard Hawthorne, FNP for routine health maintenance.   Alfredia Ferguson and I agreed with plan.    Mable Paris, FNP

## 2019-08-04 NOTE — Patient Instructions (Addendum)
Ensure you are taking coreg twice per day with meals.  Monitor blood pressure,  Goal is less than 120/80, based on newest guidelines; if persistently higher, please make sooner follow up appointment so we can recheck you blood pressure and manage medications  Increase water has helps with uric acid lowering.   Let me know if you feel you need the prednisone for gout.   Labs in one month.

## 2019-08-04 NOTE — Assessment & Plan Note (Signed)
Very pleased as BP is improving very well taken by me after resting exam room 142/85.  The patient is diabetic and Coreg twice daily, have advised him to start doing this and to let me know blood pressures do not reach goal after week of therapy

## 2019-08-04 NOTE — Progress Notes (Deleted)
Virtual Visit via Video Note  I connected with@  on 08/04/19 at 12:00 PM EST by a video enabled telemedicine application and verified that I am speaking with the correct person using two identifiers.  Location patient: home Location provider:work or home office Persons participating in the virtual visit: patient, provider  I discussed the limitations of evaluation and management by telemedicine and the availability of in person appointments. The patient expressed understanding and agreed to proceed.   HPI:  Follow-up hypertension.  Blood pressures at home started out 132/109, heart rate 8312 days ago.  Yesterday blood pressure was 143/109 with heart rate 99. HTN- amlodipine 10mg , carvedilol 6.25 BID.   Gout Prophylaxis-allopurinol; he is not on the prednisone at this time   ROS: See pertinent positives and negatives per HPI.  Past Medical History:  Diagnosis Date  . Allergy    Hay fever  . Anal fissure   . Arthritis   . Back pain, chronic    abnormality L4-5, per pt  . Blood in stool   . Colon polyp   . Kidney stones     Past Surgical History:  Procedure Laterality Date  . NOSE SURGERY      Family History  Problem Relation Age of Onset  . Colon cancer Mother   . Hypertension Mother   . Hyperlipidemia Father   . Heart disease Father   . Hypertension Father   . Diabetes Father   . Rheum arthritis Sister   . Arthritis Maternal Grandmother   . Hyperlipidemia Maternal Grandmother   . Hypertension Maternal Grandmother   . Heart disease Paternal Grandfather     SOCIAL HX: ***   Current Outpatient Medications:  .  allopurinol (ZYLOPRIM) 100 MG tablet, Take 1 tablet (100 mg total) by mouth daily., Disp: 90 tablet, Rfl: 0 .  amLODipine (NORVASC) 10 MG tablet, TAKE 1 TABLET DAILY, Disp: 90 tablet, Rfl: 3 .  carvedilol (COREG) 6.25 MG tablet, Take 1 tablet (6.25 mg total) by mouth 2 (two) times daily with a meal., Disp: 60 tablet, Rfl: 3 .  loratadine (CLARITIN) 10 MG  tablet, TAKE 1 TABLET DAILY, Disp: 90 tablet, Rfl: 3 .  predniSONE (DELTASONE) 10 MG tablet, Take 40 mg by mouth on day 1, then taper 10 mg daily until gone, Disp: 10 tablet, Rfl: 0  EXAM:  VITALS per patient if applicable:  GENERAL: alert, oriented, appears well and in no acute distress  HEENT: atraumatic, conjunttiva clear, no obvious abnormalities on inspection of external nose and ears  NECK: normal movements of the head and neck  LUNGS: on inspection no signs of respiratory distress, breathing rate appears normal, no obvious gross SOB, gasping or wheezing  CV: no obvious cyanosis  MS: moves all visible extremities without noticeable abnormality  PSYCH/NEURO: pleasant and cooperative, no obvious depression or anxiety, speech and thought processing grossly intact  ASSESSMENT AND PLAN:  Discussed the following assessment and plan:  No diagnosis found.  -we discussed possible serious and likely etiologies, options for evaluation and workup, limitations of telemedicine visit vs in person visit, treatment, treatment risks and precautions. Pt prefers to treat via telemedicine empirically rather then risking or undertaking an in person visit at this moment. Patient agrees to seek prompt in person care if worsening, new symptoms arise, or if is not improving with treatment.   I discussed the assessment and treatment plan with the patient. The patient was provided an opportunity to ask questions and all were answered. The patient agreed with  the plan and demonstrated an understanding of the instructions.   The patient was advised to call back or seek an in-person evaluation if the symptoms worsen or if the condition fails to improve as anticipated.   Mable Paris, FNP

## 2019-08-04 NOTE — Assessment & Plan Note (Addendum)
Mild symptom per patient. No tenderness on exam. ?  Whether this is the beginning of a gout flare.  Right ankle is where he does have recurrent episodes of gout.  started on allopurinol discussed with him the goal of uric acid to be less than 7.  Consulted with Catie PharmD and safe to start on allopurinol during acute flare.   Advised if pain were to increase, he may start prednisone and he will let me know. Declines XR of ankle today.Will recheck uric acid in a month.

## 2019-08-07 ENCOUNTER — Encounter: Payer: Self-pay | Admitting: Family

## 2019-08-16 ENCOUNTER — Other Ambulatory Visit: Payer: Self-pay

## 2019-08-16 DIAGNOSIS — I10 Essential (primary) hypertension: Secondary | ICD-10-CM

## 2019-08-16 MED ORDER — ALLOPURINOL 100 MG PO TABS: 100.0000 mg | ORAL_TABLET | Freq: Every day | ORAL | 1 refills | Status: DC

## 2019-08-16 MED ORDER — CARVEDILOL 6.25 MG PO TABS: 6.2500 mg | ORAL_TABLET | Freq: Two times a day (BID) | ORAL | 3 refills | Status: DC

## 2019-09-04 ENCOUNTER — Other Ambulatory Visit (INDEPENDENT_AMBULATORY_CARE_PROVIDER_SITE_OTHER): Payer: Managed Care, Other (non HMO)

## 2019-09-04 ENCOUNTER — Other Ambulatory Visit: Payer: Self-pay | Admitting: Family

## 2019-09-04 ENCOUNTER — Other Ambulatory Visit: Payer: Self-pay

## 2019-09-04 ENCOUNTER — Encounter: Payer: Self-pay | Admitting: Family

## 2019-09-04 ENCOUNTER — Ambulatory Visit (INDEPENDENT_AMBULATORY_CARE_PROVIDER_SITE_OTHER): Payer: Managed Care, Other (non HMO) | Admitting: Family

## 2019-09-04 DIAGNOSIS — M79641 Pain in right hand: Secondary | ICD-10-CM

## 2019-09-04 DIAGNOSIS — M25531 Pain in right wrist: Secondary | ICD-10-CM | POA: Insufficient documentation

## 2019-09-04 DIAGNOSIS — M109 Gout, unspecified: Secondary | ICD-10-CM

## 2019-09-04 DIAGNOSIS — I10 Essential (primary) hypertension: Secondary | ICD-10-CM | POA: Diagnosis not present

## 2019-09-04 LAB — C-REACTIVE PROTEIN: CRP: <1 mg/dL (ref 0.5–20.0)

## 2019-09-04 LAB — URIC ACID: Uric Acid, Serum: 6.9 mg/dL (ref 4.0–7.8)

## 2019-09-04 MED ORDER — PREDNISONE 10 MG PO TABS: ORAL_TABLET | ORAL | 1 refills | Status: DC

## 2019-09-04 MED ORDER — PREDNISONE 10 MG PO TABS: ORAL_TABLET | ORAL | 0 refills | Status: DC

## 2019-09-04 NOTE — Assessment & Plan Note (Addendum)
Uncontrolled.  On average he does appear to running 150/104 on 2 blood pressure medications.  He is taking his first dose of hydralazine 10 mg a couple hours ago.  Advised him to start this 3 times a day.  He will monitor for feeling dizzy, lightheaded.  Certainly he understands if he has any chest pain, shortness of breath, he must go to emergency room or call 911. We again had a long conversation regards to my concern for resistant hypertension.  Unfortunately I am limited in regards to blood pressure medications I can use due to his history of gout which of late has been exacerbated.  We agreed that he needs further work-up, referral has been placed to nephrology after I secure chatted with Dr Anthonette Legato that his practice does evaluate for resistant HTN. Placed urgent. Will follow referral as well monitor patient closely while we work to lower blood pressure.

## 2019-09-04 NOTE — Assessment & Plan Note (Addendum)
Gout uncontrolled, exacerbated. Limited in that this visit was over video.  I could appreciate scant swelling of right lateral wrist when compared to left wrist.  No erythema.  Discussed infection versus gout.  Patient feels at this symptom feels very much like the gout he is had in his ankle.  I advised him to stay very vigilant however he may start prednisone.  Pending uric acid, we may have to increase the allopurinol.  He will remain on allopurinol as per pharmD this doesn't need to be stopped during acute flare. Again, I feel most comfortable with him establishing with nephrology for further evaluation of gout.

## 2019-09-04 NOTE — Telephone Encounter (Signed)
When you call pt today Let them know that I referred him to [facility], Dr. Anthonette Legato however I marked as first available as it may not be Dr. Holley Raring that he ends up seeing.

## 2019-09-04 NOTE — Telephone Encounter (Signed)
Call pt around 4 today to see how he is doing and if any changes in his bp Ask him too to call us tomorrow to let us know how he is doing

## 2019-09-04 NOTE — Progress Notes (Signed)
Virtual Visit via Video Note  I connected with@  on 09/04/19 at 12:30 PM EST by a video enabled telemedicine application and verified that I am speaking with the correct person using two identifiers.  Location patient: home Location provider:work  Persons participating in the virtual visit: patient, provider  I discussed the limitations of evaluation and management by telemedicine and the availability of in person appointments. The patient expressed understanding and agreed to proceed.   HPI: Acute visit for elevated BP and gout flare  HTN- average 150/104.  compliant with amlodipine 10mg , coreg 6.36 mg BID.  Started hydralazine 10mg  this morning Denies exertional chest pain or pressure, numbness or tingling radiating to left arm or jaw, palpitations, dizziness, frequent headaches, changes in vision, or shortness of breath.   Right wrist pain- swollen and warm, lateral side. Not red.  6 days ago had a pain in the wrist, which resolved. Returned 3 days ago and has progressively gotten worse. No fever, laceration. No numbness in arm and hand.  Hasnt started prednisone.   Right handed.    ROS: See pertinent positives and negatives per HPI.  Past Medical History:  Diagnosis Date  . Allergy    Hay fever  . Anal fissure   . Arthritis   . Back pain, chronic    abnormality L4-5, per pt  . Blood in stool   . Colon polyp   . Kidney stones     Past Surgical History:  Procedure Laterality Date  . NOSE SURGERY      Family History  Problem Relation Age of Onset  . Colon cancer Mother   . Hypertension Mother   . Hyperlipidemia Father   . Heart disease Father   . Hypertension Father   . Diabetes Father   . Rheum arthritis Sister   . Arthritis Maternal Grandmother   . Hyperlipidemia Maternal Grandmother   . Hypertension Maternal Grandmother   . Heart disease Paternal Grandfather     SOCIAL HX: never smoker   Current Outpatient Medications:  .  hydrALAZINE (APRESOLINE) 10  MG tablet, Take 5 mg by mouth 3 (three) times daily., Disp: , Rfl:  .  allopurinol (ZYLOPRIM) 100 MG tablet, Take 1 tablet (100 mg total) by mouth daily., Disp: 90 tablet, Rfl: 1 .  amLODipine (NORVASC) 10 MG tablet, TAKE 1 TABLET DAILY, Disp: 90 tablet, Rfl: 3 .  carvedilol (COREG) 6.25 MG tablet, Take 1 tablet (6.25 mg total) by mouth 2 (two) times daily with a meal., Disp: 180 tablet, Rfl: 3 .  loratadine (CLARITIN) 10 MG tablet, TAKE 1 TABLET DAILY, Disp: 90 tablet, Rfl: 3 .  predniSONE (DELTASONE) 10 MG tablet, Take 40 mg by mouth on day 1, then taper 10 mg daily until gone, Disp: 10 tablet, Rfl: 1  EXAM:  VITALS per patient if applicable:  At home. 169/107  GENERAL: alert, oriented, appears well and in no acute distress  HEENT: atraumatic, conjunttiva clear, no obvious abnormalities on inspection of external nose and ears  NECK: normal movements of the head and neck  LUNGS: on inspection no signs of respiratory distress, breathing rate appears normal, no obvious gross SOB, gasping or wheezing  CV: no obvious cyanosis  MS: moves all visible extremities without noticeable abnormality. Right wrist: Scant swelling of right lateral wrist when compared to left wrist.  No erythema  PSYCH/NEURO: pleasant and cooperative, no obvious depression or anxiety, speech and thought processing grossly intact  ASSESSMENT AND PLAN:  Discussed the following assessment and plan:  Right hand pain - Plan: CBC with Differential/Platelet, C-reactive protein, CYCLIC CITRUL PEPTIDE ANTIBODY, IGG/IGA, predniSONE (DELTASONE) 10 MG tablet, Ambulatory referral to Nephrology, DISCONTINUED: predniSONE (DELTASONE) 10 MG tablet  Essential hypertension - Plan: Ambulatory referral to Nephrology Problem List Items Addressed This Visit      Cardiovascular and Mediastinum   HTN (hypertension)    Uncontrolled.  On average he does appear to running 150/104 on 2 blood pressure medications.  He is taking his first  dose of hydralazine 10 mg a couple hours ago.  Advised him to start this 3 times a day.  He will monitor for feeling dizzy, lightheaded.  Certainly he understands if he has any chest pain, shortness of breath, he must go to emergency room or call 911. We again had a long conversation regards to my concern for resistant hypertension.  Unfortunately I am limited in regards to blood pressure medications I can use due to his history of gout which of late has been exacerbated.  We agreed that he needs further work-up, referral has been placed to nephrology after I secure chatted with Dr Anthonette Legato that his practice does evaluate for resistant HTN. Placed urgent. Will follow referral as well monitor patient closely while we work to lower blood pressure.       Relevant Medications   hydrALAZINE (APRESOLINE) 10 MG tablet   Other Relevant Orders   Ambulatory referral to Nephrology     Other   Right hand pain    Gout uncontrolled, exacerbated. Limited in that this visit was over video.  I could appreciate scant swelling of right lateral wrist when compared to left wrist.  No erythema.  Discussed infection versus gout.  Patient feels at this symptom feels very much like the gout he is had in his ankle.  I advised him to stay very vigilant however he may start prednisone.  Pending uric acid, we may have to increase the allopurinol.  He will remain on allopurinol as per pharmD this doesn't need to be stopped during acute flare. Again, I feel most comfortable with him establishing with nephrology for further evaluation of gout.      Relevant Medications   predniSONE (DELTASONE) 10 MG tablet   Other Relevant Orders   CBC with Differential/Platelet   C-reactive protein   CYCLIC CITRUL PEPTIDE ANTIBODY, IGG/IGA   Ambulatory referral to Nephrology      -we discussed possible serious and likely etiologies, options for evaluation and workup, limitations of telemedicine visit vs in person visit, treatment,  treatment risks and precautions. Pt prefers to treat via telemedicine empirically rather then risking or undertaking an in person visit at this moment. Patient agrees to seek prompt in person care if worsening, new symptoms arise, or if is not improving with treatment.   I discussed the assessment and treatment plan with the patient. The patient was provided an opportunity to ask questions and all were answered. The patient agreed with the plan and demonstrated an understanding of the instructions.   The patient was advised to call back or seek an in-person evaluation if the symptoms worsen or if the condition fails to improve as anticipated.   Mable Paris, FNP

## 2019-09-05 NOTE — Telephone Encounter (Signed)
Left message for patient to call back  

## 2019-09-06 ENCOUNTER — Telehealth: Payer: Self-pay | Admitting: Family

## 2019-09-06 DIAGNOSIS — Z125 Encounter for screening for malignant neoplasm of prostate: Secondary | ICD-10-CM

## 2019-09-06 DIAGNOSIS — I10 Essential (primary) hypertension: Secondary | ICD-10-CM

## 2019-09-06 DIAGNOSIS — M109 Gout, unspecified: Secondary | ICD-10-CM

## 2019-09-06 MED ORDER — ALLOPURINOL 100 MG PO TABS: 200.0000 mg | ORAL_TABLET | Freq: Every day | ORAL | 2 refills | Status: DC

## 2019-09-06 MED ORDER — COLCHICINE 0.6 MG PO TABS: 0.6000 mg | ORAL_TABLET | Freq: Every day | ORAL | 1 refills | Status: DC

## 2019-09-06 NOTE — Telephone Encounter (Signed)
Tony Scott with patient; you do not need to call him.   Discussed my conversation with Dr Derrel Nip from 09/05/19.  Wrist pain has improved, though slight. No fever, n, v.   Taking hydralazine 5 mg TID 149/87 today. No CP, SOB, leg swelling.  Otherwise feels well.   Advised continue hydralazine 5mg  TID since appears may be lowering.  Advised colchicine 0.6 for one week, then stop.  Advised increased allopurinol to 200mg  to bring uric acid closer to 6 ( his is 6.9). Patient in agreement with plan and will let me know of any concerns as well as continue to monitor BP.   Of note, I consulted with Pharm.D., Catie Darnelle Maffucci in regards to drug reaction with colchicine and Coreg.  She advised that it was safe as colchicine at a low dose.

## 2019-09-07 NOTE — Progress Notes (Signed)
LM for patient to call back or send me a mychart & I can get him scheduled. Wanted to check ho BP was as well.

## 2019-09-12 ENCOUNTER — Encounter: Payer: Self-pay | Admitting: Family

## 2019-09-13 NOTE — Telephone Encounter (Signed)
FYI in regards to mychart I have made him an appointment at 11 Monday.

## 2019-09-15 ENCOUNTER — Other Ambulatory Visit: Payer: Managed Care, Other (non HMO)

## 2019-09-18 ENCOUNTER — Ambulatory Visit: Payer: Managed Care, Other (non HMO)

## 2019-09-18 ENCOUNTER — Encounter: Payer: Self-pay | Admitting: Family

## 2019-09-18 ENCOUNTER — Other Ambulatory Visit: Payer: Self-pay

## 2019-09-18 ENCOUNTER — Ambulatory Visit (INDEPENDENT_AMBULATORY_CARE_PROVIDER_SITE_OTHER): Payer: Managed Care, Other (non HMO) | Admitting: Family

## 2019-09-18 VITALS — BP 140/85 | HR 69 | Temp 97.3°F | Ht 72.0 in | Wt 230.6 lb

## 2019-09-18 DIAGNOSIS — Z125 Encounter for screening for malignant neoplasm of prostate: Secondary | ICD-10-CM

## 2019-09-18 DIAGNOSIS — M25531 Pain in right wrist: Secondary | ICD-10-CM

## 2019-09-18 DIAGNOSIS — I10 Essential (primary) hypertension: Secondary | ICD-10-CM | POA: Diagnosis not present

## 2019-09-18 DIAGNOSIS — M542 Cervicalgia: Secondary | ICD-10-CM | POA: Diagnosis not present

## 2019-09-18 LAB — C-REACTIVE PROTEIN: CRP: <1 mg/dL (ref 0.5–20.0)

## 2019-09-18 LAB — VITAMIN D 25 HYDROXY (VIT D DEFICIENCY, FRACTURES): VITD: 17.77 ng/mL — ABNORMAL LOW (ref 30.00–100.00)

## 2019-09-18 LAB — PSA: PSA: 2.53 ng/mL (ref 0.10–4.00)

## 2019-09-18 LAB — URIC ACID: Uric Acid, Serum: 6.9 mg/dL (ref 4.0–7.8)

## 2019-09-18 LAB — B12 AND FOLATE PANEL
Folate: 12.5 ng/mL (ref >5.9)
Vitamin B-12: 228 pg/mL (ref 211–911)

## 2019-09-18 LAB — SEDIMENTATION RATE: Sed Rate: 17 mm/hr (ref 0–20)

## 2019-09-18 LAB — TSH: TSH: 1.19 u[IU]/mL (ref 0.35–4.50)

## 2019-09-18 MED ORDER — DICLOFENAC SODIUM 1 % EX GEL: 4.0000 g | Freq: Four times a day (QID) | CUTANEOUS | 1 refills | Status: DC

## 2019-09-18 NOTE — Assessment & Plan Note (Addendum)
Improved however not at goal. Advised to increase hydralazine to 10mg  TID from the 5mg . He will pay close attention to symptoms including dizziness lightheadedness with escalation of regimen. He will  Monitor BP at home.  We discussed suspicion of his blood pressure cuff may not be entirely accurate.  He will bring this with him at follow-up Of note, patient has upcoming appoint with nephrology in May, we agreed to keep this appointment as in particular would like patient to have renal ultrasound.

## 2019-09-18 NOTE — Patient Instructions (Addendum)
We will make you appointment with Dr Lorelei Pont at [place].   Trial increase hydralazine to 10mg  three times per day.  Monitor blood pressure,  Goal is less than 120/80, or least 130/80, based on newest guidelines; if persistently higher, please make sooner follow up appointment so we can recheck you blood pressure and manage medications

## 2019-09-18 NOTE — Progress Notes (Signed)
Subjective:    Patient ID: Tony Scott, male    DOB: 1960-08-05, 59 y.o.   MRN: [id number]  CC: GLENROY BABAUTA is a 59 y.o. male who presents today for follow up.   HPI: HTN-Blood pressure at home today, 140/107, yesterday 140/97. Unsure of accuracy of BP cuff. Takes blood pressure when he thinks about it, doesn't necessarily rest for 10 minutes.    When plays golf, No sob, cp.   Continues to have right lateral wrist pain, however today it is the best it has felt in 3 weeks. Doesn't think it was gout. Pain comes and goes in regards to how painful it is. Today, he feels fine. If opens a top, towels off after shower, or hold his cell phone, the pain will begin and there is exquisite when it starts  Sister has RA.   No increased heat, fever, swollen. No pain in fingers.   Would like to play golf again. Wrist pain improved when on prednisone.  Right handed. Numbness from forearm to right lateral fingers. Has pain in neck, more noticeable when driving longer distances.   DG c-spine C4-5 DDD.  NO h.o cancer.  HISTORY:  Past Medical History:  Diagnosis Date  . Allergy    Hay fever  . Anal fissure   . Arthritis   . Back pain, chronic    abnormality L4-5, per pt  . Blood in stool   . Colon polyp   . Kidney stones    Past Surgical History:  Procedure Laterality Date  . NOSE SURGERY     Family History  Problem Relation Age of Onset  . Colon cancer Mother   . Hypertension Mother   . Hyperlipidemia Father   . Heart disease Father   . Hypertension Father   . Diabetes Father   . Rheum arthritis Sister   . Arthritis Maternal Grandmother   . Hyperlipidemia Maternal Grandmother   . Hypertension Maternal Grandmother   . Heart disease Paternal Grandfather     Allergies: Patient has no known allergies. Current Outpatient Medications on File Prior to Visit  Medication Sig Dispense Refill  . allopurinol (ZYLOPRIM) 100 MG tablet Take 2 tablets (200 mg total) by mouth daily. 120  tablet 2  . amLODipine (NORVASC) 10 MG tablet TAKE 1 TABLET DAILY 90 tablet 3  . carvedilol (COREG) 6.25 MG tablet Take 1 tablet (6.25 mg total) by mouth 2 (two) times daily with a meal. 180 tablet 3  . colchicine 0.6 MG tablet Take 1 tablet (0.6 mg total) by mouth daily for 7 days. 30 tablet 1  . hydrALAZINE (APRESOLINE) 10 MG tablet Take 5 mg by mouth 3 (three) times daily.    Marland Kitchen loratadine (CLARITIN) 10 MG tablet TAKE 1 TABLET DAILY 90 tablet 3   No current facility-administered medications on file prior to visit.    Social History   Tobacco Use  . Smoking status: Never Smoker  . Smokeless tobacco: Never Used  Substance Use Topics  . Alcohol use: Yes    Alcohol/week: 8.0 standard drinks    Types: 8 drink(s) per week    Comment: social  . Drug use: No    Review of Systems  Constitutional: Negative for chills and fever.  Respiratory: Negative for cough.   Cardiovascular: Negative for chest pain and palpitations.  Gastrointestinal: Negative for nausea and vomiting.  Musculoskeletal: Positive for arthralgias and neck pain.      Objective:    BP 140/85 Comment: right  Pulse 69   Temp (!) 97.3 F (36.3 C)   Ht 6' (1.829 m)   Wt 230 lb 9.6 oz (104.6 kg)   SpO2 98%   BMI 31.27 kg/m  BP Readings from Last 3 Encounters:  09/18/19 140/85  08/04/19 (!) 142/85  07/19/19 (!) 166/92   Wt Readings from Last 3 Encounters:  09/18/19 230 lb 9.6 oz (104.6 kg)  08/04/19 229 lb (103.9 kg)  07/19/19 227 lb (103 kg)    Physical Exam Vitals reviewed.  Constitutional:      Appearance: He is well-developed.  Cardiovascular:     Rate and Rhythm: Regular rhythm.     Heart sounds: Normal heart sounds.  Pulmonary:     Effort: Pulmonary effort is normal. No respiratory distress.     Breath sounds: Normal breath sounds. No wheezing, rhonchi or rales.  Skin:    General: Skin is warm and dry.  Neurological:     Mental Status: He is alert.  Psychiatric:        Speech: Speech normal.         Behavior: Behavior normal.        Assessment & Plan:   Problem List Items Addressed This Visit      Cardiovascular and Mediastinum   HTN (hypertension)    Improved however not at goal. Advised to increase hydralazine to 10mg  TID from the 5mg . He will pay close attention to symptoms including dizziness lightheadedness with escalation of regimen. He will  Monitor BP at home.  We discussed suspicion of his blood pressure cuff may not be entirely accurate.  He will bring this with him at follow-up Of note, patient has upcoming appoint with nephrology in May, we agreed to keep this appointment as in particular would like patient to have renal ultrasound.        Other   Neck pain   Relevant Orders   DG Cervical Spine Complete   Right wrist pain - Primary    Duration 3 weeks, comes and goes.  Discussed differentials including tendinitis ( golf), rheumatoid arthritis (although I presume less likely), ulnar nerve entrapment.  Pending x-ray, labs, and also a consult with sports medicine Dr. Frederico Hamman Copland. Close follow up.       Relevant Medications   diclofenac Sodium (VOLTAREN) 1 % GEL   Other Relevant Orders   ANA   C-reactive protein   CYCLIC CITRUL PEPTIDE ANTIBODY, IGG/IGA   Rheumatoid factor   Sedimentation rate   TSH   Uric acid   B12 and Folate Panel   VITAMIN D 25 Hydroxy (Vit-D Deficiency, Fractures)   DG Wrist Complete Right    Other Visit Diagnoses    Screening for prostate cancer       Relevant Orders   PSA       I have discontinued Cecilie Lowers L. Jerkins's predniSONE. I am also having him start on diclofenac Sodium. Additionally, I am having him maintain his loratadine, amLODipine, carvedilol, hydrALAZINE, colchicine, and allopurinol.   Meds ordered this encounter  Medications  . diclofenac Sodium (VOLTAREN) 1 % GEL    Sig: Apply 4 g topically 4 (four) times daily.    Dispense:  50 g    Refill:  1    Order Specific Question:   Supervising Provider     Answer:   Crecencio Mc [[id number]]    Return precautions given.   Risks, benefits, and alternatives of the medications and treatment plan prescribed today were discussed, and patient expressed understanding.  Education regarding symptom management and diagnosis given to patient on AVS.  Continue to follow with Burnard Hawthorne, FNP for routine health maintenance.   Alfredia Ferguson and I agreed with plan.   Mable Paris, FNP

## 2019-09-18 NOTE — Assessment & Plan Note (Signed)
Duration 3 weeks, comes and goes.  Discussed differentials including tendinitis ( golf), rheumatoid arthritis (although I presume less likely), ulnar nerve entrapment.  Pending x-ray, labs, and also a consult with sports medicine Dr. Frederico Hamman Copland. Close follow up.

## 2019-09-19 ENCOUNTER — Ambulatory Visit (INDEPENDENT_AMBULATORY_CARE_PROVIDER_SITE_OTHER): Payer: Managed Care, Other (non HMO)

## 2019-09-19 ENCOUNTER — Other Ambulatory Visit: Payer: Self-pay | Admitting: Internal Medicine

## 2019-09-19 ENCOUNTER — Other Ambulatory Visit: Payer: Self-pay | Admitting: *Deleted

## 2019-09-19 ENCOUNTER — Telehealth: Payer: Self-pay | Admitting: Family

## 2019-09-19 DIAGNOSIS — M25531 Pain in right wrist: Secondary | ICD-10-CM | POA: Diagnosis not present

## 2019-09-19 DIAGNOSIS — M542 Cervicalgia: Secondary | ICD-10-CM

## 2019-09-19 NOTE — Progress Notes (Signed)
Patient has had xrays completed & scheduled with Dr. Lorelei Pont 3/22.

## 2019-09-19 NOTE — Telephone Encounter (Signed)
Jeneen Rinks with [employer] called he would like a call back about images sent over on pt. please contact Jeneen Rinks at [phone]

## 2019-09-20 ENCOUNTER — Other Ambulatory Visit: Payer: Self-pay | Admitting: Family

## 2019-09-20 DIAGNOSIS — R768 Other specified abnormal immunological findings in serum: Secondary | ICD-10-CM

## 2019-09-20 DIAGNOSIS — M109 Gout, unspecified: Secondary | ICD-10-CM

## 2019-09-20 DIAGNOSIS — I10 Essential (primary) hypertension: Secondary | ICD-10-CM

## 2019-09-20 LAB — ANTI-NUCLEAR AB-TITER (ANA TITER): ANA Titer 1: 1:80 {titer} — ABNORMAL HIGH

## 2019-09-20 LAB — CYCLIC CITRUL PEPTIDE ANTIBODY, IGG/IGA: Cyclic Citrullin Peptide Ab: 3 units (ref 0–19)

## 2019-09-20 LAB — ANA: Anti Nuclear Antibody (ANA): POSITIVE — AB

## 2019-09-20 LAB — RHEUMATOID FACTOR: Rheumatoid fact SerPl-aCnc: <14 IU/mL (ref <14)

## 2019-09-20 MED ORDER — ALLOPURINOL 300 MG PO TABS: 300.0000 mg | ORAL_TABLET | Freq: Every day | ORAL | 1 refills | Status: DC

## 2019-09-20 MED ORDER — HYDRALAZINE HCL 10 MG PO TABS: 10.0000 mg | ORAL_TABLET | Freq: Three times a day (TID) | ORAL | 3 refills | Status: DC

## 2019-09-22 ENCOUNTER — Telehealth: Payer: Self-pay | Admitting: Family

## 2019-09-22 NOTE — Telephone Encounter (Signed)
LMTCB

## 2019-09-22 NOTE — Telephone Encounter (Signed)
noted 

## 2019-09-22 NOTE — Telephone Encounter (Signed)
Call pt His ana is positive- he may be better served to see rheumatology However I see he has an upcoming appt with Dr Lorelei Pont which I had advised when seeing him that day  If he would like to see Copland, that is fine, however I didn't want to burden him with too many appointments. He may also decide to wait to see rheumatology; referral placed though I dont see appt yet. Please ask him to call us if he doesn't hear from [place] in a one week with an appt scheduled.

## 2019-09-22 NOTE — Telephone Encounter (Signed)
Just FYI patient decided to go the route of just seeing rheumatology I have cancelled appointment with Dr. Lorelei Pont for now. I asked that he call us back if he does not hear about referral within the next week.

## 2019-09-22 NOTE — Telephone Encounter (Signed)
Pt returned your call.  

## 2019-09-25 ENCOUNTER — Telehealth: Payer: Self-pay

## 2019-09-25 ENCOUNTER — Encounter: Payer: Self-pay | Admitting: Family

## 2019-09-25 NOTE — Telephone Encounter (Signed)
I called patient & LM to call back.

## 2019-09-25 NOTE — Telephone Encounter (Signed)
I called patient & asked him to call back to go over below.

## 2019-09-27 ENCOUNTER — Ambulatory Visit: Payer: Managed Care, Other (non HMO) | Admitting: Family

## 2019-10-02 ENCOUNTER — Ambulatory Visit: Payer: Managed Care, Other (non HMO) | Admitting: Family Medicine

## 2019-10-09 ENCOUNTER — Encounter: Payer: Self-pay | Admitting: Family

## 2019-10-09 NOTE — Telephone Encounter (Signed)
Patient called office about information on his hand.

## 2019-10-10 ENCOUNTER — Other Ambulatory Visit: Payer: Self-pay | Admitting: Family

## 2019-10-10 ENCOUNTER — Telehealth: Payer: Self-pay

## 2019-10-10 DIAGNOSIS — M25531 Pain in right wrist: Secondary | ICD-10-CM

## 2019-10-10 DIAGNOSIS — J302 Other seasonal allergic rhinitis: Secondary | ICD-10-CM

## 2019-10-10 NOTE — Telephone Encounter (Signed)
I spoke with patient & he was inquiring about referral that was placed. He was not a fan of [facility] & thinks that's where he went about 10 yrs ago. He said that his hand is really limiting him & he is in pain off & on. He still did not want to go to [facility] walk-in & was on his was to [facility] right anyway. I asked if he wanted referral to be placed as urgent & he said no. I told him to let us know if he changes his mind.  He also was inquiring about J&J vaccine & where  He could get it. I let him know that [facility] had it, but were now out. I suggested that he checked regularly with [facility] & may could check at ones throughout the state as he was in route to [place].

## 2019-10-11 NOTE — Telephone Encounter (Signed)
Close  

## 2019-10-12 ENCOUNTER — Other Ambulatory Visit: Payer: Self-pay

## 2019-10-12 DIAGNOSIS — I10 Essential (primary) hypertension: Secondary | ICD-10-CM

## 2019-10-12 MED ORDER — HYDRALAZINE HCL 10 MG PO TABS: 10.0000 mg | ORAL_TABLET | Freq: Three times a day (TID) | ORAL | 3 refills | Status: DC

## 2019-10-23 ENCOUNTER — Ambulatory Visit: Payer: Managed Care, Other (non HMO) | Admitting: Family

## 2019-10-23 ENCOUNTER — Other Ambulatory Visit: Payer: Self-pay

## 2019-10-23 ENCOUNTER — Encounter: Payer: Self-pay | Admitting: Family

## 2019-10-23 ENCOUNTER — Other Ambulatory Visit: Payer: Self-pay | Admitting: Family

## 2019-10-23 VITALS — BP 120/76 | HR 73 | Temp 97.1°F | Ht 72.0 in | Wt 230.8 lb

## 2019-10-23 DIAGNOSIS — R7989 Other specified abnormal findings of blood chemistry: Secondary | ICD-10-CM

## 2019-10-23 DIAGNOSIS — Z125 Encounter for screening for malignant neoplasm of prostate: Secondary | ICD-10-CM | POA: Diagnosis not present

## 2019-10-23 DIAGNOSIS — M25531 Pain in right wrist: Secondary | ICD-10-CM

## 2019-10-23 DIAGNOSIS — I10 Essential (primary) hypertension: Secondary | ICD-10-CM | POA: Diagnosis not present

## 2019-10-23 DIAGNOSIS — R768 Other specified abnormal immunological findings in serum: Secondary | ICD-10-CM

## 2019-10-23 DIAGNOSIS — E785 Hyperlipidemia, unspecified: Secondary | ICD-10-CM

## 2019-10-23 DIAGNOSIS — R7689 Other specified abnormal immunological findings in serum: Secondary | ICD-10-CM

## 2019-10-23 DIAGNOSIS — M2550 Pain in unspecified joint: Secondary | ICD-10-CM

## 2019-10-23 DIAGNOSIS — M109 Gout, unspecified: Secondary | ICD-10-CM

## 2019-10-23 LAB — LIPID PANEL
Cholesterol: 221 mg/dL — ABNORMAL HIGH (ref 0–200)
HDL: 53.1 mg/dL (ref >39.00)
LDL Cholesterol: 147 mg/dL — ABNORMAL HIGH (ref 0–99)
NonHDL: 168.16
Total CHOL/HDL Ratio: 4
Triglycerides: 108 mg/dL (ref 0.0–149.0)
VLDL: 21.6 mg/dL (ref 0.0–40.0)

## 2019-10-23 LAB — CBC WITH DIFFERENTIAL/PLATELET
Basophils Absolute: 0 10*3/uL (ref 0.0–0.1)
Basophils Relative: 0.6 % (ref 0.0–3.0)
Eosinophils Absolute: 0.4 10*3/uL (ref 0.0–0.7)
Eosinophils Relative: 8.7 % — ABNORMAL HIGH (ref 0.0–5.0)
HCT: 46.4 % (ref 39.0–52.0)
Hemoglobin: 15.9 g/dL (ref 13.0–17.0)
Lymphocytes Relative: 30.7 % (ref 12.0–46.0)
Lymphs Abs: 1.5 10*3/uL (ref 0.7–4.0)
MCHC: 34.3 g/dL (ref 30.0–36.0)
MCV: 89.5 fl (ref 78.0–100.0)
Monocytes Absolute: 0.5 10*3/uL (ref 0.1–1.0)
Monocytes Relative: 10.8 % (ref 3.0–12.0)
Neutro Abs: 2.3 10*3/uL (ref 1.4–7.7)
Neutrophils Relative %: 49.2 % (ref 43.0–77.0)
Platelets: 195 10*3/uL (ref 150.0–400.0)
RBC: 5.19 Mil/uL (ref 4.22–5.81)
RDW: 13.5 % (ref 11.5–15.5)
WBC: 4.7 10*3/uL (ref 4.0–10.5)

## 2019-10-23 LAB — COMPREHENSIVE METABOLIC PANEL
ALT: 39 U/L (ref 0–53)
AST: 24 U/L (ref 0–37)
Albumin: 4.5 g/dL (ref 3.5–5.2)
Alkaline Phosphatase: 59 U/L (ref 39–117)
BUN: 14 mg/dL (ref 6–23)
CO2: 28 mEq/L (ref 19–32)
Calcium: 9.4 mg/dL (ref 8.4–10.5)
Chloride: 102 mEq/L (ref 96–112)
Creatinine, Ser: 0.98 mg/dL (ref 0.40–1.50)
GFR: 78.76 mL/min (ref >60.00)
Glucose, Bld: 104 mg/dL — ABNORMAL HIGH (ref 70–99)
Potassium: 4.8 mEq/L (ref 3.5–5.1)
Sodium: 139 mEq/L (ref 135–145)
Total Bilirubin: 0.5 mg/dL (ref 0.2–1.2)
Total Protein: 7 g/dL (ref 6.0–8.3)

## 2019-10-23 LAB — HEMOGLOBIN A1C: Hgb A1c MFr Bld: 5.6 % (ref 4.6–6.5)

## 2019-10-23 LAB — PSA: PSA: 2.5 ng/mL (ref 0.10–4.00)

## 2019-10-23 LAB — URIC ACID: Uric Acid, Serum: 5.9 mg/dL (ref 4.0–7.8)

## 2019-10-23 LAB — VITAMIN D 25 HYDROXY (VIT D DEFICIENCY, FRACTURES): VITD: 16.8 ng/mL — ABNORMAL LOW (ref 30.00–100.00)

## 2019-10-23 LAB — TSH: TSH: 1.62 u[IU]/mL (ref 0.35–4.50)

## 2019-10-23 NOTE — Assessment & Plan Note (Signed)
He has had consult with rheumatology, suspicion that could be false positive.  Repeating ANA today.

## 2019-10-23 NOTE — Assessment & Plan Note (Addendum)
Well controlled on current regimen. Upcoming nephrology consult. Will follow

## 2019-10-23 NOTE — Assessment & Plan Note (Signed)
Not quite at goal.  Pending uric acid today

## 2019-10-23 NOTE — Assessment & Plan Note (Signed)
Improved. Declines orthopedic consult.

## 2019-10-23 NOTE — Patient Instructions (Signed)
It is very important that you call us and let me know when ready for me to  refer you to gastroenterology for colonoscopy.  You are due in 2014.  Please let me know but this will bring for you.  Otherwise, so happy with blood pressure.   Let us know if anything at all.

## 2019-10-23 NOTE — Progress Notes (Signed)
Subjective:    Patient ID: Tony Scott, male    DOB: 1960-08-29, 59 y.o.   MRN: [id number]  CC: Tony Scott is a 59 y.o. male who presents today for follow up.   HPI: Feels well today, no new complaints  HTN- compliant with medication. Not checking at home as not sure of accuracy of blood pressure cuff. Denies exertional chest pain or pressure, numbness or tingling radiating to left arm or jaw, palpitations, dizziness, frequent headaches, changes in vision, or shortness of breath.   History of gout-compliant with allopurinol.  Is not taking the colchicine.  Right wrist pain improved. Able to play golf two days ago. hasnt heard from Dr Lake Bells in regards to appointment.  however declines going to orthopedics at this time. He will give his pain more time.    Saw Dr Meda Coffee today. Low suspicion for autoimmune disease.  Ordered repeat ANA ( we will do in our office).  He has follow-up scheduled with her for October 2021 Due colonoscopy  HISTORY:  Past Medical History:  Diagnosis Date  . Allergy    Hay fever  . Anal fissure   . Arthritis   . Back pain, chronic    abnormality L4-5, per pt  . Blood in stool   . Colon polyp   . Kidney stones    Past Surgical History:  Procedure Laterality Date  . NOSE SURGERY     Family History  Problem Relation Age of Onset  . Colon cancer Mother   . Hypertension Mother   . Hyperlipidemia Father   . Heart disease Father   . Hypertension Father   . Diabetes Father   . Rheum arthritis Sister   . Arthritis Maternal Grandmother   . Hyperlipidemia Maternal Grandmother   . Hypertension Maternal Grandmother   . Heart disease Paternal Grandfather     Allergies: Patient has no known allergies. Current Outpatient Medications on File Prior to Visit  Medication Sig Dispense Refill  . allopurinol (ZYLOPRIM) 300 MG tablet Take 1 tablet (300 mg total) by mouth daily. 90 tablet 1  . amLODipine (NORVASC) 10 MG tablet TAKE 1 TABLET DAILY 90 tablet 3    . carvedilol (COREG) 6.25 MG tablet Take 1 tablet (6.25 mg total) by mouth 2 (two) times daily with a meal. 180 tablet 3  . diclofenac Sodium (VOLTAREN) 1 % GEL Apply 4 g topically 4 (four) times daily. 50 g 1  . hydrALAZINE (APRESOLINE) 10 MG tablet Take 1 tablet (10 mg total) by mouth 3 (three) times daily. 270 tablet 3  . loratadine (CLARITIN) 10 MG tablet TAKE 1 TABLET DAILY 90 tablet 3   No current facility-administered medications on file prior to visit.    Social History   Tobacco Use  . Smoking status: Never Smoker  . Smokeless tobacco: Never Used  Substance Use Topics  . Alcohol use: Yes    Alcohol/week: 8.0 standard drinks    Types: 8 drink(s) per week    Comment: social  . Drug use: No    Review of Systems  Constitutional: Negative for chills and fever.  HENT: Negative for congestion, ear pain, rhinorrhea, sinus pressure and sore throat.   Respiratory: Negative for cough, shortness of breath and wheezing.   Cardiovascular: Negative for chest pain and palpitations.  Gastrointestinal: Negative for diarrhea, nausea and vomiting.  Genitourinary: Negative for dysuria.  Musculoskeletal: Positive for arthralgias. Negative for myalgias.  Skin: Negative for rash.  Neurological: Negative for headaches.  Hematological:  Negative for adenopathy.      Objective:    BP 120/76   Pulse 73   Temp (!) 97.1 F (36.2 C) (Temporal)   Ht 6' (1.829 m)   Wt 230 lb 12.8 oz (104.7 kg)   SpO2 96%   BMI 31.30 kg/m  BP Readings from Last 3 Encounters:  10/23/19 120/76  09/18/19 140/85  08/04/19 (!) 142/85   Wt Readings from Last 3 Encounters:  10/23/19 230 lb 12.8 oz (104.7 kg)  09/18/19 230 lb 9.6 oz (104.6 kg)  08/04/19 229 lb (103.9 kg)    Physical Exam Vitals reviewed.  Constitutional:      Appearance: He is well-developed.  Cardiovascular:     Rate and Rhythm: Regular rhythm.     Heart sounds: Normal heart sounds.  Pulmonary:     Effort: Pulmonary effort is normal.  No respiratory distress.     Breath sounds: Normal breath sounds. No wheezing, rhonchi or rales.  Skin:    General: Skin is warm and dry.  Neurological:     Mental Status: He is alert.  Psychiatric:        Speech: Speech normal.        Behavior: Behavior normal.        Assessment & Plan:   Problem List Items Addressed This Visit      Cardiovascular and Mediastinum   HTN (hypertension) - Primary    Well controlled on current regimen. Upcoming nephrology consult. Will follow      Relevant Orders   TSH   Hemoglobin A1c   VITAMIN D 25 Hydroxy (Vit-D Deficiency, Fractures)   Comprehensive metabolic panel     Other   Arthralgia   Relevant Orders   Uric acid   Gout    Not quite at goal.  Pending uric acid today      HLD (hyperlipidemia)   Relevant Orders   Lipid panel   Positive ANA (antinuclear antibody)    He has had consult with rheumatology, suspicion that could be false positive.  Repeating ANA today.      Relevant Orders   ANA   CBC with Differential/Platelet   Right wrist pain    Improved. Declines orthopedic consult.        Other Visit Diagnoses    Screening for prostate cancer       Relevant Orders   PSA     Of note declines colonoscopy at this time.  He will let me know when he is ready for that referral.  CPE labs ordered.  I have discontinued Tony Scott's colchicine. I am also having him maintain his amLODipine, carvedilol, diclofenac Sodium, allopurinol, loratadine, and hydrALAZINE.   No orders of the defined types were placed in this encounter.   Return precautions given.   Risks, benefits, and alternatives of the medications and treatment plan prescribed today were discussed, and patient expressed understanding.   Education regarding symptom management and diagnosis given to patient on AVS.  Continue to follow with Burnard Hawthorne, FNP for routine health maintenance.   Tony Scott and I agreed with plan.   Tony Paris,  FNP

## 2019-10-24 ENCOUNTER — Telehealth: Payer: Self-pay | Admitting: Family

## 2019-10-24 NOTE — Telephone Encounter (Signed)
"  Patient was seen in Rheumatology for this issue on 10/23/19." [facility] said about 18 hours ago  "Rejection Reason - Patient went elsewhere" [facility] said about 18 hours ago

## 2019-10-25 ENCOUNTER — Telehealth: Payer: Self-pay | Admitting: Family

## 2019-10-25 NOTE — Telephone Encounter (Signed)
See result note.  

## 2019-10-25 NOTE — Telephone Encounter (Signed)
Pt returned your call.  

## 2019-10-26 LAB — ANA: Anti Nuclear Antibody (ANA): POSITIVE — AB

## 2019-10-26 LAB — ANTI-NUCLEAR AB-TITER (ANA TITER): ANA Titer 1: 1:80 {titer} — ABNORMAL HIGH

## 2019-11-10 ENCOUNTER — Telehealth: Payer: Self-pay | Admitting: Family

## 2019-11-10 ENCOUNTER — Other Ambulatory Visit: Payer: Self-pay

## 2019-11-10 ENCOUNTER — Encounter: Payer: Self-pay | Admitting: Emergency Medicine

## 2019-11-10 ENCOUNTER — Ambulatory Visit
Admission: EM | Admit: 2019-11-10 | Discharge: 2019-11-10 | Disposition: A | Discharge status: Home / Self Care | Payer: Managed Care, Other (non HMO) | Attending: Family Medicine | Admitting: Family Medicine

## 2019-11-10 DIAGNOSIS — R600 Localized edema: Secondary | ICD-10-CM

## 2019-11-10 NOTE — Discharge Instructions (Addendum)
Since you have received the J&J Covid vaccine in the last 3 weeks, I will go ahead and check your bloodwork today. I am looking for any anemia or blood count variation.  I will also go ahead and check your liver and kidneys to be sure that they have remained stable.  If all of your bloodwork comes back normal, I would have you address the leg swelling with the kidney specialist on Monday.   If bloodwork is abnormal and you need further evaluation, I will call and let you know once these results are back.  If your legs are swelling worse before I talk to you again, you develop any painful cramping, especially with walking, experience any shortness of breath, trouble swallowing, go directly to the ER.

## 2019-11-10 NOTE — Telephone Encounter (Signed)
Spoken to patient, both legs are swollen. Was just the ankles and feet yesterday, patient thought it was because he was up on his feet all day. When he awoke today both legs are swollen up to his knees. His knees ache when bending and both calves are tight. He is having no chest pain, SOB, jaw pain, blurry vision, and arm pain.has HX of hypertension Informed patient that due to no appointments in office, he will have to go to UC/ED. Patient stated he will go to UC to evaluated.

## 2019-11-10 NOTE — ED Triage Notes (Signed)
Pt c/o bilateral leg swelling. Started about 3 days ago. He states he has been on his feet more recently. He states it was just his ankles until last night it started going into his knees. Denies chest pain or shortness of breath. He states his wife says he seems more short of breath. He [employer] and [employer] covid vaccine.

## 2019-11-10 NOTE — Telephone Encounter (Signed)
Pt called in and wanted to speak to Judson Roch about his legs being swollen. He said it started in his feet and ankles, then it went to calves and now his knees are swollen. It hurts to bend them. Sent to [facility] for triage.

## 2019-11-10 NOTE — ED Provider Notes (Signed)
Roderic Palau    CSN: [id number] Arrival date & time: 11/10/19  1421      History   Chief Complaint Chief Complaint  Patient presents with  . Leg Swelling    bilateral    HPI Tony Scott is a 59 y.o. male.   Patient reports that he is experiencing bilateral lower extremity swelling for the last 3 to 4 days.  Reports that last night it was really uncomfortable for him to walk.  Denies calf pain or cramping.  Denies calf pain with walking.  Reports that the area just feels "taut."  Reports that he had instructed in [place] vaccine about 3 weeks ago.  He does have a history of hypertension and he is treated by his primary care for this.  He also has reports that he has an appointment with nephrology on Monday to be sure that his kidneys are receiving appropriate circulation to see if this is related to his hypertension.  Denies headache, new onset shortness of breath, nausea, vomiting, diarrhea, rash, fever, other symptoms.  ROS per HPI  The history is provided by the patient.    Past Medical History:  Diagnosis Date  . Allergy    Hay fever  . Anal fissure   . Arthritis   . Back pain, chronic    abnormality L4-5, per pt  . Blood in stool   . Colon polyp   . Kidney stones     Patient Active Problem List   Diagnosis Date Noted  . Positive ANA (antinuclear antibody) 09/20/2019  . Right wrist pain 09/04/2019  . HLD (hyperlipidemia) 11/28/2018  . Gout 11/04/2018  . Skin lesion 03/21/2018  . Arthralgia 12/13/2017  . Memory changes 12/13/2017  . History of colonic polyps 01/06/2016  . Allergic rhinitis 03/08/2015  . HTN (hypertension) 10/29/2014  . Neck pain 09/04/2013  . Routine general medical examination at a health care facility 03/09/2013    Past Surgical History:  Procedure Laterality Date  . NOSE SURGERY         Home Medications    Prior to Admission medications   Medication Sig Start Date End Date Taking? Authorizing Provider    allopurinol (ZYLOPRIM) 300 MG tablet Take 1 tablet (300 mg total) by mouth daily. 09/20/19  Yes Burnard Hawthorne, FNP  amLODipine (NORVASC) 10 MG tablet TAKE 1 TABLET DAILY 07/26/19  Yes Burnard Hawthorne, FNP  carvedilol (COREG) 6.25 MG tablet Take 1 tablet (6.25 mg total) by mouth 2 (two) times daily with a meal. 08/16/19  Yes Arnett, Yvetta Coder, FNP  diclofenac Sodium (VOLTAREN) 1 % GEL Apply 4 g topically 4 (four) times daily. 09/18/19  Yes Arnett, Yvetta Coder, FNP  hydrALAZINE (APRESOLINE) 10 MG tablet Take 1 tablet (10 mg total) by mouth 3 (three) times daily. 10/12/19  Yes Burnard Hawthorne, FNP  loratadine (CLARITIN) 10 MG tablet TAKE 1 TABLET DAILY 10/10/19  Yes Burnard Hawthorne, FNP    Family History Family History  Problem Relation Age of Onset  . Colon cancer Mother   . Hypertension Mother   . Hyperlipidemia Father   . Heart disease Father   . Hypertension Father   . Diabetes Father   . Rheum arthritis Sister   . Arthritis Maternal Grandmother   . Hyperlipidemia Maternal Grandmother   . Hypertension Maternal Grandmother   . Heart disease Paternal Grandfather     Social History Social History   Tobacco Use  . Smoking status: Never Smoker  .  Smokeless tobacco: Never Used  Substance Use Topics  . Alcohol use: Yes    Alcohol/week: 8.0 standard drinks    Types: 8 drink(s) per week    Comment: social  . Drug use: No     Allergies   Patient has no known allergies.   Review of Systems Review of Systems   Physical Exam Triage Vital Signs ED Triage Vitals  Enc Vitals Group     BP 11/10/19 1426 132/80     Pulse Rate 11/10/19 1426 75     Resp 11/10/19 1426 18     Temp 11/10/19 1426 97.9 F (36.6 C)     Temp Source 11/10/19 1426 Oral     SpO2 11/10/19 1426 95 %     Weight 11/10/19 1427 230 lb 13.2 oz (104.7 kg)     Height 11/10/19 1427 6' (1.829 m)     Head Circumference --      Peak Flow --      Pain Score 11/10/19 1427 0     Pain Loc --      Pain Edu? --       Excl. in [place]? --    No data found.  Updated Vital Signs BP 132/80 (BP Location: Left Arm)   Pulse 75   Temp 97.9 F (36.6 C) (Oral)   Resp 18   Ht 6' (1.829 m)   Wt 230 lb 13.2 oz (104.7 kg)   SpO2 95%   BMI 31.31 kg/m   Visual Acuity Right Eye Distance:   Left Eye Distance:   Bilateral Distance:    Right Eye Near:   Left Eye Near:    Bilateral Near:     Physical Exam Vitals and nursing note reviewed.  Constitutional:      General: He is not in acute distress.    Appearance: Normal appearance. He is well-developed and normal weight. He is not ill-appearing.  HENT:     Head: Normocephalic and atraumatic.     Mouth/Throat:     Mouth: Mucous membranes are moist.     Pharynx: Oropharynx is clear.  Eyes:     Extraocular Movements: Extraocular movements intact.     Conjunctiva/sclera: Conjunctivae normal.     Pupils: Pupils are equal, round, and reactive to light.  Cardiovascular:     Rate and Rhythm: Normal rate and regular rhythm.     Heart sounds: Normal heart sounds. No murmur.  Pulmonary:     Effort: Pulmonary effort is normal. No respiratory distress.     Breath sounds: Normal breath sounds. No stridor. No wheezing, rhonchi or rales.  Chest:     Chest wall: No tenderness.  Abdominal:     General: Bowel sounds are normal.     Palpations: Abdomen is soft.     Tenderness: There is no abdominal tenderness.  Musculoskeletal:     Cervical back: Normal range of motion and neck supple.     Right lower leg: 2+ Pitting Edema present.     Left lower leg: 2+ Pitting Edema present.  Skin:    General: Skin is warm and dry.     Capillary Refill: Capillary refill takes less than 2 seconds.  Neurological:     General: No focal deficit present.     Mental Status: He is alert and oriented to person, place, and time.  Psychiatric:        Mood and Affect: Mood normal.        Thought Content: Thought content normal.  UC Treatments / Results  Labs (all labs  ordered are listed, but only abnormal results are displayed) Labs Reviewed  CBC WITH DIFFERENTIAL/PLATELET  COMPREHENSIVE METABOLIC PANEL    EKG   Radiology No results found.  Procedures Procedures (including critical care time)  Medications Ordered in UC Medications - No data to display  Initial Impression / Assessment and Plan / UC Course  I have reviewed the triage vital signs and the nursing notes.  Pertinent labs & imaging results that were available during my care of the patient were reviewed by me and considered in my medical decision making (see chart for details).     Edema of both lower extremities with: Presents with right lower extremity edema for the last 2 to 3 days.  Increases during the day and is worse at night.  Upon exam, lungs CTA bilaterally, no acute distress, normal heart sounds S1-S2.  Blood pressure 132/80, not elevated for this patient.  On further exam, both lower extremities with 1+ pitting edema.  Pedal pulses are are normal at 2+ bilaterally.  No noted edema in upper extremities or face.  Discussed with patient that since he had the [place] vaccine 3 weeks ago, that would be to a CBC and a CMP to check for any issues with thrombocytopenia, anemia, kidney and liver function.  Instructed patient that if he begins experiencing an increase in the swelling in his legs, change in sensation in his feet or toes, any cramping at the calf or any calf pain with walking, severe headache, change in vision, epigastric pain, new onset back pain, that he will report to the ER directly.  If he is feeling fine throughout the night, we will see what his blood work shows and will reevaluate once those results are back.  Also discussed with patient that if blood work results are normal, if symptoms do not progress that we will let him follow-up with nephrology on Monday to address swelling and kidney function. Declines going to the ER today, strict precautions given  for this.  Patient verbalizes understanding and is in agreement with treatment plan. Final Clinical Impressions(s) / UC Diagnoses   Final diagnoses:  Edema of both lower extremities     Discharge Instructions     Since you have received the [employer] Covid vaccine in the last 3 weeks, I will go ahead and check your bloodwork today. I am looking for any anemia or blood count variation.  I will also go ahead and check your liver and kidneys to be sure that they have remained stable.  If all of your bloodwork comes back normal, I would have you address the leg swelling with the kidney specialist on Monday.   If bloodwork is abnormal and you need further evaluation, I will call and let you know once these results are back.  If your legs are swelling worse before I talk to you again, you develop any painful cramping, especially with walking, experience any shortness of breath, trouble swallowing, go directly to the ER.    ED Prescriptions    None     PDMP not reviewed this encounter.   Faustino Congress, NP 11/10/19 1720

## 2019-11-10 NOTE — ED Notes (Signed)
Pt c/o bilateral leg swelling. Started about 3 days ago. He states he has been on his feet more recently. He states it was just his ankles until last night it started going into his knees. Denies chest pain or shortness of breath. He states his wife says he seems more short of breath. He [employer] covid vaccine.

## 2019-11-13 LAB — COMPREHENSIVE METABOLIC PANEL
ALT: 35 IU/L (ref 0–44)
AST: 26 IU/L (ref 0–40)
Albumin/Globulin Ratio: 1.6 (ref 1.2–2.2)
Albumin: 4.6 g/dL (ref 3.8–4.9)
Alkaline Phosphatase: 61 IU/L (ref 39–117)
BUN/Creatinine Ratio: 19 (ref 9–20)
BUN: 21 mg/dL (ref 6–24)
Bilirubin Total: 0.3 mg/dL (ref 0.0–1.2)
CO2: 22 mmol/L (ref 20–29)
Calcium: 9.9 mg/dL (ref 8.7–10.2)
Chloride: 100 mmol/L (ref 96–106)
Creatinine, Ser: 1.09 mg/dL (ref 0.76–1.27)
GFR calc Af Amer: 87 mL/min/{1.73_m2} (ref >59)
GFR calc non Af Amer: 75 mL/min/{1.73_m2} (ref >59)
Globulin, Total: 2.9 g/dL (ref 1.5–4.5)
Potassium: 5.3 mmol/L — ABNORMAL HIGH (ref 3.5–5.2)
Sodium: 140 mmol/L (ref 134–144)
Total Protein: 7.5 g/dL (ref 6.0–8.5)

## 2019-11-13 LAB — CBC WITH DIFFERENTIAL/PLATELET
Basophils Absolute: 0.1 10*3/uL (ref 0.0–0.2)
Basos: 1 %
EOS (ABSOLUTE): 0.5 10*3/uL — ABNORMAL HIGH (ref 0.0–0.4)
Eos: 7 %
Hematocrit: 46.8 % (ref 37.5–51.0)
Hemoglobin: 16.1 g/dL (ref 13.0–17.7)
Immature Grans (Abs): 0 10*3/uL (ref 0.0–0.1)
Immature Granulocytes: 0 %
Lymphocytes Absolute: 1.8 10*3/uL (ref 0.7–3.1)
Lymphs: 26 %
MCH: 30.6 pg (ref 26.6–33.0)
MCHC: 34.4 g/dL (ref 31.5–35.7)
MCV: 89 fL (ref 79–97)
Monocytes Absolute: 0.7 10*3/uL (ref 0.1–0.9)
Monocytes: 10 %
Neutrophils Absolute: 4 10*3/uL (ref 1.4–7.0)
Neutrophils: 56 %
Platelets: 264 10*3/uL (ref 150–450)
RBC: 5.27 x10E6/uL (ref 4.14–5.80)
RDW: 12.8 % (ref 11.6–15.4)
WBC: 7.1 10*3/uL (ref 3.4–10.8)

## 2019-11-20 ENCOUNTER — Other Ambulatory Visit: Payer: Self-pay

## 2019-11-20 ENCOUNTER — Other Ambulatory Visit (INDEPENDENT_AMBULATORY_CARE_PROVIDER_SITE_OTHER): Payer: Managed Care, Other (non HMO)

## 2019-11-20 DIAGNOSIS — R7989 Other specified abnormal findings of blood chemistry: Secondary | ICD-10-CM | POA: Diagnosis not present

## 2019-11-20 LAB — CBC WITH DIFFERENTIAL/PLATELET
Basophils Absolute: 0 10*3/uL (ref 0.0–0.1)
Basophils Relative: 0.9 % (ref 0.0–3.0)
Eosinophils Absolute: 0.4 10*3/uL (ref 0.0–0.7)
Eosinophils Relative: 7.1 % — ABNORMAL HIGH (ref 0.0–5.0)
HCT: 47.7 % (ref 39.0–52.0)
Hemoglobin: 15.9 g/dL (ref 13.0–17.0)
Lymphocytes Relative: 28.4 % (ref 12.0–46.0)
Lymphs Abs: 1.4 10*3/uL (ref 0.7–4.0)
MCHC: 33.4 g/dL (ref 30.0–36.0)
MCV: 90.4 fl (ref 78.0–100.0)
Monocytes Absolute: 0.4 10*3/uL (ref 0.1–1.0)
Monocytes Relative: 8.7 % (ref 3.0–12.0)
Neutro Abs: 2.8 10*3/uL (ref 1.4–7.7)
Neutrophils Relative %: 54.9 % (ref 43.0–77.0)
Platelets: 232 10*3/uL (ref 150.0–400.0)
RBC: 5.27 Mil/uL (ref 4.22–5.81)
RDW: 13.6 % (ref 11.5–15.5)
WBC: 5.1 10*3/uL (ref 4.0–10.5)

## 2019-11-28 ENCOUNTER — Other Ambulatory Visit: Payer: Self-pay | Admitting: Family

## 2019-11-28 DIAGNOSIS — R7989 Other specified abnormal findings of blood chemistry: Secondary | ICD-10-CM

## 2019-11-28 NOTE — Progress Notes (Signed)
Agree with seeing patient in office for leg swelling as well as reducing amlodipine.  Noted conversation. He refuses to follow up at this time.

## 2019-12-26 ENCOUNTER — Telehealth: Payer: Self-pay | Admitting: Family

## 2019-12-26 NOTE — Telephone Encounter (Signed)
FYI  Called pt to discuss frustration recently with urgent care, triage  Left detailed message and apologized for recent frustration I advised that of course would like to continue to have him as a patient.  Most important however I reiterated that he needed to have a repeat lab ( CBC) to ensure findings have normalized. Encouraged him to call the office to schedule a lab with [place] and of course I was was happy to speak to him further as well

## 2019-12-27 ENCOUNTER — Encounter: Payer: Self-pay | Admitting: Family

## 2019-12-27 ENCOUNTER — Other Ambulatory Visit: Payer: Self-pay

## 2019-12-27 DIAGNOSIS — M109 Gout, unspecified: Secondary | ICD-10-CM

## 2019-12-29 ENCOUNTER — Other Ambulatory Visit: Payer: Self-pay | Admitting: Family

## 2019-12-29 DIAGNOSIS — I10 Essential (primary) hypertension: Secondary | ICD-10-CM

## 2019-12-29 MED ORDER — AMLODIPINE BESYLATE 5 MG PO TABS: 5.0000 mg | ORAL_TABLET | Freq: Every day | ORAL | 1 refills | Status: DC

## 2020-01-14 ENCOUNTER — Other Ambulatory Visit: Payer: Self-pay

## 2020-01-14 ENCOUNTER — Emergency Department: Payer: Managed Care, Other (non HMO)

## 2020-01-14 ENCOUNTER — Encounter: Payer: Self-pay | Admitting: Emergency Medicine

## 2020-01-14 ENCOUNTER — Emergency Department
Admission: EM | Admit: 2020-01-14 | Discharge: 2020-01-14 | Disposition: A | Discharge status: Home / Self Care | Payer: Managed Care, Other (non HMO) | Attending: Emergency Medicine | Admitting: Emergency Medicine

## 2020-01-14 DIAGNOSIS — M545 Low back pain, unspecified: Secondary | ICD-10-CM

## 2020-01-14 DIAGNOSIS — Z79899 Other long term (current) drug therapy: Secondary | ICD-10-CM | POA: Diagnosis not present

## 2020-01-14 DIAGNOSIS — I10 Essential (primary) hypertension: Secondary | ICD-10-CM | POA: Diagnosis not present

## 2020-01-14 MED ORDER — KETOROLAC TROMETHAMINE 30 MG/ML IJ SOLN
30.0000 mg | Freq: Once | INTRAMUSCULAR | Status: AC
  Administered 2020-01-14: 30 mg via INTRAMUSCULAR
  Filled 2020-01-14: qty 1

## 2020-01-14 MED ORDER — METHYLPREDNISOLONE SODIUM SUCC 125 MG IJ SOLR
125.0000 mg | Freq: Once | INTRAMUSCULAR | Status: AC
  Administered 2020-01-14: 125 mg via INTRAMUSCULAR
  Filled 2020-01-14: qty 2

## 2020-01-14 MED ORDER — PREDNISONE 10 MG PO TABS
ORAL_TABLET | ORAL | 0 refills | Status: DC
Start: 2020-01-14 — End: 2020-09-09

## 2020-01-14 MED ORDER — CYCLOBENZAPRINE HCL 5 MG PO TABS
ORAL_TABLET | ORAL | 0 refills | Status: DC
Start: 2020-01-14 — End: 2020-10-17

## 2020-01-14 MED ORDER — HYDROCODONE-ACETAMINOPHEN 5-325 MG PO TABS: 2.0000 | ORAL_TABLET | Freq: Four times a day (QID) | ORAL | 0 refills | Status: AC | PRN

## 2020-01-14 MED ORDER — IBUPROFEN 600 MG PO TABS
600.0000 mg | ORAL_TABLET | Freq: Three times a day (TID) | ORAL | 0 refills | Status: DC | PRN
Start: 2020-01-14 — End: 2020-09-09

## 2020-01-14 NOTE — ED Provider Notes (Signed)
[facility] Emergency Department Provider Note  ____________________________________________  Time seen: Approximately 10:22 AM  I have reviewed the triage vital signs and the nursing notes.   HISTORY  Chief Complaint Back Pain    HPI Tony Scott is a 59 y.o. male that presents to the emergency department for evaluation of acute on chronic mid back pain this morning.  Patient states that he felt his back "give out "while he was cutting cardboard.  He has had chronic back pain for over 15 years.  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  Last flareup was several years ago and required steroids.  He has not fallen.  No bowel or bladder dysfunction or saddle anesthesias.  No nausea, vomiting, abdominal pain, numbness, tingling, leg pain.   Past Medical History:  Diagnosis Date  . Allergy    Hay fever  . Anal fissure   . Arthritis   . Back pain, chronic    abnormality L4-5, per pt  . Blood in stool   . Colon polyp   . Kidney stones     Patient Active Problem List   Diagnosis Date Noted  . Positive ANA (antinuclear antibody) 09/20/2019  . Right wrist pain 09/04/2019  . HLD (hyperlipidemia) 11/28/2018  . Gout 11/04/2018  . Skin lesion 03/21/2018  . Arthralgia 12/13/2017  . Memory changes 12/13/2017  . History of colonic polyps 01/06/2016  . Allergic rhinitis 03/08/2015  . HTN (hypertension) 10/29/2014  . Neck pain 09/04/2013  . Routine general medical examination at a health care facility 03/09/2013    Past Surgical History:  Procedure Laterality Date  . NOSE SURGERY      Prior to Admission medications   Medication Sig Start Date End Date Taking? Authorizing Provider  allopurinol (ZYLOPRIM) 300 MG tablet Take 1 tablet (300 mg total) by mouth daily. 09/20/19   Burnard Hawthorne, FNP  amLODipine (NORVASC) 5 MG tablet Take 1 tablet (5 mg total) by mouth daily. 12/29/19   Burnard Hawthorne, FNP  carvedilol (COREG) 6.25 MG tablet Take 1  tablet (6.25 mg total) by mouth 2 (two) times daily with a meal. 08/16/19   Burnard Hawthorne, FNP  cyclobenzaprine (FLEXERIL) 5 MG tablet Take 1-2 tablets 3 times daily as needed 01/14/20   Laban Emperor, PA-C  diclofenac Sodium (VOLTAREN) 1 % GEL Apply 4 g topically 4 (four) times daily. 09/18/19   Burnard Hawthorne, FNP  hydrALAZINE (APRESOLINE) 10 MG tablet Take 1 tablet (10 mg total) by mouth 3 (three) times daily. 10/12/19   Burnard Hawthorne, FNP  HYDROcodone-acetaminophen (NORCO/VICODIN) 5-325 MG tablet Take 2 tablets by mouth every 6 (six) hours as needed for up to 1 day for moderate pain. 01/14/20 01/15/20  Laban Emperor, PA-C  ibuprofen (ADVIL) 600 MG tablet Take 1 tablet (600 mg total) by mouth every 8 (eight) hours as needed. 01/14/20   Laban Emperor, PA-C  loratadine (CLARITIN) 10 MG tablet TAKE 1 TABLET DAILY 10/10/19   Burnard Hawthorne, FNP  predniSONE (DELTASONE) 10 MG tablet Take 6 tablets on day 1, take 5 tablets on day 2, take 4 tablets on day 3, take 3 tablets on day 4, take 2 tablets on day 5, take 1 tablet on day 6 01/14/20   Laban Emperor, PA-C    Allergies Patient has no known allergies.  Family History  Problem Relation Age of Onset  . Colon cancer Mother   . Hypertension Mother   . Hyperlipidemia Father   .  Heart disease Father   . Hypertension Father   . Diabetes Father   . Rheum arthritis Sister   . Arthritis Maternal Grandmother   . Hyperlipidemia Maternal Grandmother   . Hypertension Maternal Grandmother   . Heart disease Paternal Grandfather     Social History Social History   Tobacco Use  . Smoking status: Never Smoker  . Smokeless tobacco: Never Used  Vaping Use  . Vaping Use: Never used  Substance Use Topics  . Alcohol use: Yes    Alcohol/week: 8.0 standard drinks    Types: 8 drink(s) per week    Comment: social  . Drug use: No     Review of Systems  Constitutional: No fever/chills Cardiovascular: No chest pain. Respiratory: No cough. No  SOB. Gastrointestinal: No abdominal pain.  No nausea, no vomiting.  Musculoskeletal: Positive for back pain. Skin: Negative for rash, abrasions, lacerations, ecchymosis. Neurological: Negative for headaches, numbness or tingling   ____________________________________________   PHYSICAL EXAM:  VITAL SIGNS: ED Triage Vitals  Enc Vitals Group     BP 01/14/20 0952 (!) 147/90     Pulse Rate 01/14/20 0952 71     Resp 01/14/20 0952 20     Temp 01/14/20 0952 98.4 F (36.9 C)     Temp Source 01/14/20 0952 Oral     SpO2 01/14/20 0952 99 %     Weight --      Height --      Head Circumference --      Peak Flow --      Pain Score 01/14/20 0949 10     Pain Loc --      Pain Edu? --      Excl. in [place]? --      Constitutional: Alert and oriented. Well appearing and in no acute distress. Eyes: Conjunctivae are normal. PERRL. EOMI. Head: Atraumatic. ENT:      Ears:      Nose: No congestion/rhinnorhea.      Mouth/Throat: Mucous membranes are moist.  Neck: No stridor.  Cardiovascular: Normal rate, regular rhythm.  Good peripheral circulation. Respiratory: Normal respiratory effort without tachypnea or retractions. Lungs CTAB. Good air entry to the bases with no decreased or absent breath sounds. Gastrointestinal: Bowel sounds 4 quadrants. Soft and nontender to palpation. No guarding or rigidity. No palpable masses. No distention. Musculoskeletal: Full range of motion to all extremities. No gross deformities appreciated.  Mild tenderness to palpation to mid lumbar spine.  Strength equal in lower extremities bilaterally.  Normal gait. Neurologic:  Normal speech and language. No gross focal neurologic deficits are appreciated.  Skin:  Skin is warm, dry and intact. No rash noted. Psychiatric: Mood and affect are normal. Speech and behavior are normal. Patient exhibits appropriate insight and judgement.   ____________________________________________   LABS (all labs ordered are listed, but  only abnormal results are displayed)  Labs Reviewed - No data to display ____________________________________________  EKG   ____________________________________________  RADIOLOGY Robinette Haines, personally viewed and evaluated these images (plain radiographs) as part of my medical decision making, as well as reviewing the written report by the radiologist.  DG Lumbar Spine 2-3 Views  Result Date: 01/14/2020 CLINICAL DATA:  Chronic low back pain acutely worse. EXAM: LUMBAR SPINE - 2-3 VIEW COMPARISON:  03/29/2012 FINDINGS: Vertebral body alignment and heights are normal. There is mild spondylosis throughout the lumbar spine to include moderate facet arthropathy over the mid to lower lumbar spine. No evidence of compression fracture or spondylolisthesis. Disc  space narrowing at the L4-5 and L5-S1 levels without significant change. IMPRESSION: 1.  No acute findings. 2. 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 Electronically Signed   By: Marin Olp M.D.   On: 01/14/2020 10:58    ____________________________________________    PROCEDURES  Procedure(s) performed:    Procedures    Medications  methylPREDNISolone sodium succinate (SOLU-MEDROL) 125 mg/2 mL injection 125 mg (125 mg Intramuscular Given 01/14/20 1115)  ketorolac (TORADOL) 30 MG/ML injection 30 mg (30 mg Intramuscular Given 01/14/20 1115)     ____________________________________________   INITIAL IMPRESSION / ASSESSMENT AND PLAN / ED COURSE  Pertinent labs & imaging results that were available during my care of the patient were reviewed by me and considered in my medical decision making (see chart for details).  Review of the [place] CSRS was performed in accordance of the [place] prior to dispensing any controlled drugs.     Patient presents patient presented to emergency department for evaluation of low back  pain.  Vital signs and exam are reassuring.  X-ray negative for acute abnormality.  Patient given IM Toradol and Solu-Medrol for pain.  He will take muscle relaxer when he gets home.  Patient will be discharged home with prescriptions for prednisone, Motrin, Flexeril, short course of Norco. Patient is to follow up with primary care and orthopedics as directed. Patient is given ED precautions to return to the ED for any worsening or new symptoms.  Tony Scott was evaluated in Emergency Department on 01/14/2020 for the symptoms described in the history of present illness. He was evaluated in the context of the global COVID-19 pandemic, which necessitated consideration that the patient might be at risk for infection with the SARS-CoV-2 virus that causes COVID-19. Institutional protocols and algorithms that pertain to the evaluation of patients at risk for COVID-19 are in a state of rapid change based on information released by regulatory bodies including the CDC and federal and state organizations. These policies and algorithms were followed during the patient's care in the ED.   ____________________________________________  FINAL CLINICAL IMPRESSION(S) / ED DIAGNOSES  Final diagnoses:  Acute midline low back pain without sciatica      NEW MEDICATIONS STARTED DURING THIS VISIT:  ED Discharge Orders         Ordered    HYDROcodone-acetaminophen (NORCO/VICODIN) 5-325 MG tablet  Every 6 hours PRN     Discontinue  Reprint     01/14/20 1227    predniSONE (DELTASONE) 10 MG tablet     Discontinue  Reprint     01/14/20 1227    ibuprofen (ADVIL) 600 MG tablet  Every 8 hours PRN     Discontinue  Reprint     01/14/20 1227    cyclobenzaprine (FLEXERIL) 5 MG tablet     Discontinue  Reprint     01/14/20 1227              This chart was dictated using voice recognition software/Dragon. Despite best efforts to proofread, errors can occur which can change the meaning. Any change was purely  unintentional.    Laban Emperor, PA-C 01/14/20 1505    Delman Kitten, MD 01/14/20 [phone]

## 2020-01-14 NOTE — ED Triage Notes (Signed)
Pt presents to ED via POV with c/o lower back pain, pt states "I think my back gave out on me". Pt states hx of same. Pt initially ambulatory to the lobby, pt provided wheelchair upon arrival to lobby.

## 2020-01-18 ENCOUNTER — Telehealth: Payer: Self-pay | Admitting: Family

## 2020-01-18 NOTE — Telephone Encounter (Signed)
Pt was returning call 

## 2020-01-19 ENCOUNTER — Other Ambulatory Visit: Payer: Self-pay | Admitting: Family

## 2020-01-19 DIAGNOSIS — I10 Essential (primary) hypertension: Secondary | ICD-10-CM

## 2020-01-19 MED ORDER — ALLOPURINOL 100 MG PO TABS
100.0000 mg | ORAL_TABLET | Freq: Three times a day (TID) | ORAL | 3 refills | Status: DC
Start: 2020-01-19 — End: 2020-02-22

## 2020-01-19 MED ORDER — AMLODIPINE BESYLATE 5 MG PO TABS: 5.0000 mg | ORAL_TABLET | Freq: Every day | ORAL | 1 refills | Status: DC

## 2020-02-19 ENCOUNTER — Encounter: Payer: Self-pay | Admitting: Family

## 2020-02-22 ENCOUNTER — Other Ambulatory Visit: Payer: Self-pay

## 2020-02-22 MED ORDER — ALLOPURINOL 100 MG PO TABS: 100.0000 mg | ORAL_TABLET | Freq: Three times a day (TID) | ORAL | 1 refills | Status: DC

## 2020-07-01 ENCOUNTER — Other Ambulatory Visit: Payer: Self-pay | Admitting: Family

## 2020-07-01 DIAGNOSIS — I10 Essential (primary) hypertension: Secondary | ICD-10-CM

## 2020-07-23 ENCOUNTER — Other Ambulatory Visit: Payer: Self-pay | Admitting: Family

## 2020-07-23 ENCOUNTER — Encounter: Payer: Self-pay | Admitting: Family

## 2020-07-23 DIAGNOSIS — I10 Essential (primary) hypertension: Secondary | ICD-10-CM

## 2020-08-16 ENCOUNTER — Other Ambulatory Visit: Payer: Self-pay | Admitting: Family

## 2020-08-26 ENCOUNTER — Other Ambulatory Visit: Payer: Self-pay | Admitting: Family

## 2020-08-26 DIAGNOSIS — I10 Essential (primary) hypertension: Secondary | ICD-10-CM

## 2020-09-09 ENCOUNTER — Encounter: Payer: Self-pay | Admitting: Family

## 2020-09-09 ENCOUNTER — Ambulatory Visit (INDEPENDENT_AMBULATORY_CARE_PROVIDER_SITE_OTHER): Payer: Managed Care, Other (non HMO) | Admitting: Family

## 2020-09-09 ENCOUNTER — Other Ambulatory Visit: Payer: Self-pay | Admitting: Family

## 2020-09-09 ENCOUNTER — Other Ambulatory Visit: Payer: Self-pay

## 2020-09-09 VITALS — BP 132/78 | HR 68 | Temp 98.0°F | Ht 72.0 in | Wt 205.6 lb

## 2020-09-09 DIAGNOSIS — Z125 Encounter for screening for malignant neoplasm of prostate: Secondary | ICD-10-CM

## 2020-09-09 DIAGNOSIS — Z0000 Encounter for general adult medical examination without abnormal findings: Secondary | ICD-10-CM

## 2020-09-09 DIAGNOSIS — E875 Hyperkalemia: Secondary | ICD-10-CM

## 2020-09-09 DIAGNOSIS — M109 Gout, unspecified: Secondary | ICD-10-CM | POA: Diagnosis not present

## 2020-09-09 DIAGNOSIS — M2550 Pain in unspecified joint: Secondary | ICD-10-CM

## 2020-09-09 DIAGNOSIS — I10 Essential (primary) hypertension: Secondary | ICD-10-CM

## 2020-09-09 LAB — COMPREHENSIVE METABOLIC PANEL
ALT: 24 U/L (ref 0–53)
AST: 21 U/L (ref 0–37)
Albumin: 4.5 g/dL (ref 3.5–5.2)
Alkaline Phosphatase: 54 U/L (ref 39–117)
BUN: 18 mg/dL (ref 6–23)
CO2: 30 mEq/L (ref 19–32)
Calcium: 9.9 mg/dL (ref 8.4–10.5)
Chloride: 104 mEq/L (ref 96–112)
Creatinine, Ser: 0.94 mg/dL (ref 0.40–1.50)
GFR: 89.58 mL/min (ref >60.00)
Glucose, Bld: 103 mg/dL — ABNORMAL HIGH (ref 70–99)
Potassium: 5.3 mEq/L — ABNORMAL HIGH (ref 3.5–5.1)
Sodium: 139 mEq/L (ref 135–145)
Total Bilirubin: 0.7 mg/dL (ref 0.2–1.2)
Total Protein: 7.2 g/dL (ref 6.0–8.3)

## 2020-09-09 LAB — CBC WITH DIFFERENTIAL/PLATELET
Basophils Absolute: 0 10*3/uL (ref 0.0–0.1)
Basophils Relative: 0.7 % (ref 0.0–3.0)
Eosinophils Absolute: 0.4 10*3/uL (ref 0.0–0.7)
Eosinophils Relative: 6.9 % — ABNORMAL HIGH (ref 0.0–5.0)
HCT: 46 % (ref 39.0–52.0)
Hemoglobin: 15.5 g/dL (ref 13.0–17.0)
Lymphocytes Relative: 31.4 % (ref 12.0–46.0)
Lymphs Abs: 2 10*3/uL (ref 0.7–4.0)
MCHC: 33.7 g/dL (ref 30.0–36.0)
MCV: 90.5 fl (ref 78.0–100.0)
Monocytes Absolute: 0.5 10*3/uL (ref 0.1–1.0)
Monocytes Relative: 8.7 % (ref 3.0–12.0)
Neutro Abs: 3.3 10*3/uL (ref 1.4–7.7)
Neutrophils Relative %: 52.3 % (ref 43.0–77.0)
Platelets: 232 10*3/uL (ref 150.0–400.0)
RBC: 5.09 Mil/uL (ref 4.22–5.81)
RDW: 13.5 % (ref 11.5–15.5)
WBC: 6.3 10*3/uL (ref 4.0–10.5)

## 2020-09-09 LAB — LIPID PANEL
Cholesterol: 194 mg/dL (ref 0–200)
HDL: 58.1 mg/dL (ref >39.00)
LDL Cholesterol: 121 mg/dL — ABNORMAL HIGH (ref 0–99)
NonHDL: 135.57
Total CHOL/HDL Ratio: 3
Triglycerides: 73 mg/dL (ref 0.0–149.0)
VLDL: 14.6 mg/dL (ref 0.0–40.0)

## 2020-09-09 LAB — HEMOGLOBIN A1C: Hgb A1c MFr Bld: 5.6 % (ref 4.6–6.5)

## 2020-09-09 LAB — TSH: TSH: 1.13 u[IU]/mL (ref 0.35–4.50)

## 2020-09-09 LAB — PSA: PSA: 3.21 ng/mL (ref 0.10–4.00)

## 2020-09-09 LAB — URIC ACID: Uric Acid, Serum: 6.3 mg/dL (ref 4.0–7.8)

## 2020-09-09 MED ORDER — ALLOPURINOL 100 MG PO TABS: 100.0000 mg | ORAL_TABLET | Freq: Two times a day (BID) | ORAL | 1 refills | Status: DC

## 2020-09-09 NOTE — Assessment & Plan Note (Signed)
Referral for colonoscopy. Encouraged continued exercise.

## 2020-09-09 NOTE — Assessment & Plan Note (Signed)
Chronic right wrist and right knee pain. Affecting QOL. Referral to orthopedics. Will follow.

## 2020-09-09 NOTE — Patient Instructions (Signed)
Nice to see you!  Referral to orthopedic, colonoscopy  Let us know if you dont hear back within a week in regards to an appointment being scheduled.     Health Maintenance, Male Adopting a healthy lifestyle and getting preventive care are important in promoting health and wellness. Ask your health care provider about:  The right schedule for you to have regular tests and exams.  Things you can do on your own to prevent diseases and keep yourself healthy. What should I know about diet, weight, and exercise? Eat a healthy diet  Eat a diet that includes plenty of vegetables, fruits, low-fat dairy products, and lean protein.  Do not eat a lot of foods that are high in solid fats, added sugars, or sodium.   Maintain a healthy weight Body mass index (BMI) is a measurement that can be used to identify possible weight problems. It estimates body fat based on height and weight. Your health care provider can help determine your BMI and help you achieve or maintain a healthy weight. Get regular exercise Get regular exercise. This is one of the most important things you can do for your health. Most adults should:  Exercise for at least 150 minutes each week. The exercise should increase your heart rate and make you sweat (moderate-intensity exercise).  Do strengthening exercises at least twice a week. This is in addition to the moderate-intensity exercise.  Spend less time sitting. Even light physical activity can be beneficial. Watch cholesterol and blood lipids Have your blood tested for lipids and cholesterol at 58 years of age, then have this test every 5 years. You may need to have your cholesterol levels checked more often if:  Your lipid or cholesterol levels are high.  You are older than 58 years of age.  You are at high risk for heart disease. What should I know about cancer screening? Many types of cancers can be detected early and may often be prevented. Depending on your  health history and family history, you may need to have cancer screening at various ages. This may include screening for:  Colorectal cancer.  Prostate cancer.  Skin cancer.  Lung cancer. What should I know about heart disease, diabetes, and high blood pressure? Blood pressure and heart disease  High blood pressure causes heart disease and increases the risk of stroke. This is more likely to develop in people who have high blood pressure readings, are of African descent, or are overweight.  Talk with your health care provider about your target blood pressure readings.  Have your blood pressure checked: ? Every 3-5 years if you are 65-60 years of age. ? Every year if you are 71 years old or older.  If you are between the ages of 45 and 8 and are a current or former smoker, ask your health care provider if you should have a one-time screening for abdominal aortic aneurysm (AAA). Diabetes Have regular diabetes screenings. This checks your fasting blood sugar level. Have the screening done:  Once every three years after age 20 if you are at a normal weight and have a low risk for diabetes.  More often and at a younger age if you are overweight or have a high risk for diabetes. What should I know about preventing infection? Hepatitis B If you have a higher risk for hepatitis B, you should be screened for this virus. Talk with your health care provider to find out if you are at risk for hepatitis B infection. Hepatitis  C Blood testing is recommended for:  Everyone born from [birth year] through 1963.  Anyone with known risk factors for hepatitis C. Sexually transmitted infections (STIs)  You should be screened each year for STIs, including gonorrhea and chlamydia, if: ? You are sexually active and are younger than 58 years of age. ? You are older than 58 years of age and your health care provider tells you that you are at risk for this type of infection. ? Your sexual activity has changed  since you were last screened, and you are at increased risk for chlamydia or gonorrhea. Ask your health care provider if you are at risk.  Ask your health care provider about whether you are at high risk for HIV. Your health care provider may recommend a prescription medicine to help prevent HIV infection. If you choose to take medicine to prevent HIV, you should first get tested for HIV. You should then be tested every 3 months for as long as you are taking the medicine. Follow these instructions at home: Lifestyle  Do not use any products that contain nicotine or tobacco, such as cigarettes, e-cigarettes, and chewing tobacco. If you need help quitting, ask your health care provider.  Do not use street drugs.  Do not share needles.  Ask your health care provider for help if you need support or information about quitting drugs. Alcohol use  Do not drink alcohol if your health care provider tells you not to drink.  If you drink alcohol: ? Limit how much you have to 0-2 drinks a day. ? Be aware of how much alcohol is in your drink. In the [place] one drink equals one 12 oz bottle of beer (355 mL), one 5 oz glass of wine (148 mL), or one 1 oz glass of hard liquor (44 mL). General instructions  Schedule regular health, dental, and eye exams.  Stay current with your vaccines.  Tell your health care provider if: ? You often feel depressed. ? You have ever been abused or do not feel safe at home. Summary  Adopting a healthy lifestyle and getting preventive care are important in promoting health and wellness.  Follow your health care provider's instructions about healthy diet, exercising, and getting tested or screened for diseases.  Follow your health care provider's instructions on monitoring your cholesterol and blood pressure. This information is not intended to replace advice given to you by your health care provider. Make sure you discuss any questions you have with your health care  provider. Document Revised: 06/22/2018 Document Reviewed: 06/22/2018 Elsevier Patient Education  2021 [employer].

## 2020-09-09 NOTE — Progress Notes (Signed)
Subjective:    Patient ID: Tony Scott, male    DOB: [birth year], 60 y.o.   MRN: [id number]  CC: Tony Scott is a 60 y.o. male who presents today for physical exam and follow up.    HPI:  Continues to have intermittent right wrist pain, starting right lateral wrist, which radiates to entire hand. Playing golf doesn't bother hand. Feels pian with flexion such as when using a towel to dry off. He is right handed.   Right knee anterior pain. Worse with stairs.  No joint swelling, erythema, numbness, or increased heat.   Playing golf regularly. No cp, sob, leg swelling.  Denies depression, anxiety. He is coping well with pandemic.     HTN- compliant with coreg 6.25mg  BID, amlodipine 5mg ,compliant with hydralazine 10mg  BID, as he often forgets midday dose.   H/o gout- compliant with allopurinol 100mg  BID, he is not on prednisone however he likes to keep prescription on hand for if he has a flare which has historically been in left great  Toe. He cannot recall last time he had a gout flare.   Colorectal  Cancer Screening:due Prostate Cancer Screening: No difficultly urinating,  Immunizations       Tetanus - UTD         Labs: Screening labs today. Exercise: Gets regular exercise.   Alcohol use:  occasionally Smoking/tobacco use: Nonsmoker.     HISTORY:  Past Medical History:  Diagnosis Date  . Allergy    Hay fever  . Anal fissure   . Arthritis   . Back pain, chronic    abnormality L4-5, per pt  . Blood in stool   . Colon polyp   . Kidney stones     Past Surgical History:  Procedure Laterality Date  . NOSE SURGERY     Family History  Problem Relation Age of Onset  . Colon cancer Mother   . Hypertension Mother   . Hyperlipidemia Father   . Heart disease Father   . Hypertension Father   . Diabetes Father   . Rheum arthritis Sister   . Arthritis Maternal Grandmother   . Hyperlipidemia Maternal Grandmother   . Hypertension Maternal Grandmother   . Heart  disease Paternal Grandfather       ALLERGIES: Patient has no known allergies.  Current Outpatient Medications on File Prior to Visit  Medication Sig Dispense Refill  . amLODipine (NORVASC) 5 MG tablet TAKE 1 TABLET DAILY 90 tablet 3  . ascorbic acid (VITAMIN C) 1000 MG tablet Take 1 tablet by mouth daily.    . carvedilol (COREG) 6.25 MG tablet TAKE 1 TABLET TWICE A DAY WITH MEALS 180 tablet 0  . Cholecalciferol 25 MCG (1000 UT) tablet Take 1 tablet by mouth daily.    . cyclobenzaprine (FLEXERIL) 5 MG tablet Take 1-2 tablets 3 times daily as needed 20 tablet 0  . hydrALAZINE (APRESOLINE) 10 MG tablet TAKE 1 TABLET THREE TIMES A DAY (Patient taking differently: Take 10 mg by mouth 2 (two) times daily.) 270 tablet 3  . loratadine (CLARITIN) 10 MG tablet TAKE 1 TABLET DAILY 90 tablet 3   No current facility-administered medications on file prior to visit.    Social History   Tobacco Use  . Smoking status: Never Smoker  . Smokeless tobacco: Never Used  Vaping Use  . Vaping Use: Never used  Substance Use Topics  . Alcohol use: Yes    Alcohol/week: 8.0 standard drinks    Types: 8 drink(s)  per week    Comment: social  . Drug use: No    Review of Systems  Constitutional: Negative for chills and fever.  HENT: Negative for congestion.   Respiratory: Negative for cough and shortness of breath.   Cardiovascular: Negative for chest pain, palpitations and leg swelling.  Gastrointestinal: Negative for diarrhea, nausea and vomiting.  Genitourinary: Negative for difficulty urinating.  Musculoskeletal: Positive for arthralgias. Negative for back pain, joint swelling and myalgias.  Skin: Negative for rash.  Neurological: Negative for numbness and headaches.  Hematological: Negative for adenopathy.  Psychiatric/Behavioral: Negative for confusion.      Objective:    BP 132/78   Pulse 68   Temp 98 F (36.7 C)   Ht 6' (1.829 m)   Wt 205 lb 9.6 oz (93.3 kg)   SpO2 98%   BMI 27.88  kg/m   BP Readings from Last 3 Encounters:  09/09/20 132/78  01/14/20 (!) 159/89  11/10/19 132/80   Wt Readings from Last 3 Encounters:  09/09/20 205 lb 9.6 oz (93.3 kg)  11/10/19 230 lb 13.2 oz (104.7 kg)  10/23/19 230 lb 12.8 oz (104.7 kg)    Physical Exam Vitals reviewed.  Constitutional:      Appearance: He is well-developed and well-nourished.  Neck:     Thyroid: No thyroid mass or thyromegaly.  Cardiovascular:     Rate and Rhythm: Regular rhythm.     Heart sounds: Normal heart sounds.  Pulmonary:     Effort: Pulmonary effort is normal. No respiratory distress.     Breath sounds: Normal breath sounds. No wheezing, rhonchi or rales.  Lymphadenopathy:     Head:     Right side of head: No submental, submandibular, tonsillar, preauricular, posterior auricular or occipital adenopathy.     Left side of head: No submental, submandibular, tonsillar, preauricular, posterior auricular or occipital adenopathy.     Cervical: No cervical adenopathy.     Upper Body:  No axillary adenopathy present. Skin:    General: Skin is warm and dry.  Neurological:     Mental Status: He is alert.  Psychiatric:        Mood and Affect: Mood and affect normal.        Speech: Speech normal.        Behavior: Behavior normal.        Assessment & Plan:   Problem List Items Addressed This Visit      Cardiovascular and Mediastinum   HTN (hypertension)    Controlled. Continue coreg 6.25mg  BID, amlodipine 5mg ; advised to take hydralazine 10mg  TID due to short half life. Will follow and continue to discuss.         Other   Arthralgia    Chronic right wrist and right knee pain. Affecting QOL. Referral to orthopedics. Will follow.       Relevant Orders   Ambulatory referral to Orthopedic Surgery   Gout    Controlled. Pending  Uric acid. Continue allopurinol 100mg  bid.       Relevant Medications   allopurinol (ZYLOPRIM) 100 MG tablet   Other Relevant Orders   Uric acid   Routine  general medical examination at a health care facility - Primary    Referral for colonoscopy. Encouraged continued exercise.       Relevant Medications   allopurinol (ZYLOPRIM) 100 MG tablet   Other Relevant Orders   Ambulatory referral to Gastroenterology   TSH   CBC with Differential/Platelet   Comprehensive metabolic panel  Hemoglobin A1c   Lipid panel   PSA       I have discontinued Cecilie Lowers L. Braddy's diclofenac Sodium, predniSONE, and ibuprofen. I have also changed his allopurinol. Additionally, I am having him maintain his loratadine, cyclobenzaprine, amLODipine, carvedilol, hydrALAZINE, ascorbic acid, and Cholecalciferol.   Meds ordered this encounter  Medications  . allopurinol (ZYLOPRIM) 100 MG tablet    Sig: Take 1 tablet (100 mg total) by mouth 2 (two) times daily.    Dispense:  120 tablet    Refill:  1    Order Specific Question:   Supervising Provider    Answer:   Crecencio Mc [[id number]]    Return precautions given.   Risks, benefits, and alternatives of the medications and treatment plan prescribed today were discussed, and patient expressed understanding.   Education regarding symptom management and diagnosis given to patient on AVS.   Continue to follow with Burnard Hawthorne, FNP for routine health maintenance.   Alfredia Ferguson and I agreed with plan.   Mable Paris, FNP

## 2020-09-09 NOTE — Assessment & Plan Note (Signed)
Controlled. Continue coreg 6.25mg  BID, amlodipine 5mg ; advised to take hydralazine 10mg  TID due to short half life. Will follow and continue to discuss.

## 2020-09-09 NOTE — Assessment & Plan Note (Signed)
Controlled. Pending  Uric acid. Continue allopurinol 100mg  bid.

## 2020-09-10 ENCOUNTER — Other Ambulatory Visit: Payer: Self-pay

## 2020-09-10 DIAGNOSIS — E875 Hyperkalemia: Secondary | ICD-10-CM

## 2020-09-10 DIAGNOSIS — Z0000 Encounter for general adult medical examination without abnormal findings: Secondary | ICD-10-CM

## 2020-09-10 DIAGNOSIS — R7989 Other specified abnormal findings of blood chemistry: Secondary | ICD-10-CM

## 2020-09-10 DIAGNOSIS — M109 Gout, unspecified: Secondary | ICD-10-CM

## 2020-09-10 MED ORDER — ALLOPURINOL 100 MG PO TABS: 100.0000 mg | ORAL_TABLET | Freq: Three times a day (TID) | ORAL | 1 refills | Status: DC

## 2020-09-10 NOTE — Telephone Encounter (Signed)
I have spoken with patient & he is scheduled.

## 2020-09-10 NOTE — Telephone Encounter (Signed)
Pt called and results reviewed.

## 2020-09-11 ENCOUNTER — Telehealth: Payer: Self-pay | Admitting: Family

## 2020-09-11 NOTE — Telephone Encounter (Signed)
lft vm for pt to call ofc regarding referral to orthopedic surgery. Thanks

## 2020-09-13 ENCOUNTER — Other Ambulatory Visit: Payer: Self-pay | Admitting: Family

## 2020-09-13 DIAGNOSIS — J302 Other seasonal allergic rhinitis: Secondary | ICD-10-CM

## 2020-09-16 ENCOUNTER — Other Ambulatory Visit
Admission: RE | Admit: 2020-09-16 | Discharge: 2020-09-16 | Disposition: A | Discharge status: Home / Self Care | Payer: Managed Care, Other (non HMO) | Source: Ambulatory Visit | Attending: Family | Admitting: Family

## 2020-09-16 ENCOUNTER — Other Ambulatory Visit: Payer: Self-pay

## 2020-09-16 ENCOUNTER — Other Ambulatory Visit: Payer: Self-pay | Admitting: Family

## 2020-09-16 ENCOUNTER — Other Ambulatory Visit (INDEPENDENT_AMBULATORY_CARE_PROVIDER_SITE_OTHER): Payer: Managed Care, Other (non HMO)

## 2020-09-16 DIAGNOSIS — E875 Hyperkalemia: Secondary | ICD-10-CM

## 2020-09-16 DIAGNOSIS — R7989 Other specified abnormal findings of blood chemistry: Secondary | ICD-10-CM

## 2020-09-16 LAB — CBC WITH DIFFERENTIAL/PLATELET
Basophils Absolute: 0.1 10*3/uL (ref 0.0–0.1)
Basophils Relative: 1.2 % (ref 0.0–3.0)
Eosinophils Absolute: 0.5 10*3/uL (ref 0.0–0.7)
Eosinophils Relative: 9.1 % — ABNORMAL HIGH (ref 0.0–5.0)
HCT: 45.1 % (ref 39.0–52.0)
Hemoglobin: 15 g/dL (ref 13.0–17.0)
Lymphocytes Relative: 27 % (ref 12.0–46.0)
Lymphs Abs: 1.5 10*3/uL (ref 0.7–4.0)
MCHC: 33.3 g/dL (ref 30.0–36.0)
MCV: 91.2 fl (ref 78.0–100.0)
Monocytes Absolute: 0.4 10*3/uL (ref 0.1–1.0)
Monocytes Relative: 8.1 % (ref 3.0–12.0)
Neutro Abs: 2.9 10*3/uL (ref 1.4–7.7)
Neutrophils Relative %: 54.6 % (ref 43.0–77.0)
Platelets: 223 10*3/uL (ref 150.0–400.0)
RBC: 4.94 Mil/uL (ref 4.22–5.81)
RDW: 13.5 % (ref 11.5–15.5)
WBC: 5.4 10*3/uL (ref 4.0–10.5)

## 2020-09-16 LAB — BASIC METABOLIC PANEL
Anion gap: 8 (ref 5–15)
BUN: 15 mg/dL (ref 6–23)
BUN: 15 mg/dL (ref 6–20)
CO2: 32 mEq/L (ref 19–32)
CO2: 28 mmol/L (ref 22–32)
Calcium: 9.6 mg/dL (ref 8.4–10.5)
Calcium: 8.8 mg/dL — ABNORMAL LOW (ref 8.9–10.3)
Chloride: 105 mEq/L (ref 96–112)
Chloride: 103 mmol/L (ref 98–111)
Creatinine, Ser: 0.98 mg/dL (ref 0.40–1.50)
Creatinine, Ser: 0.91 mg/dL (ref 0.61–1.24)
GFR, Estimated: >60 mL/min (ref >60)
GFR: 85.2 mL/min (ref >60.00)
Glucose, Bld: 109 mg/dL — ABNORMAL HIGH (ref 70–99)
Glucose, Bld: 96 mg/dL (ref 70–99)
Potassium: 5.7 mEq/L — ABNORMAL HIGH (ref 3.5–5.1)
Potassium: 4.4 mmol/L (ref 3.5–5.1)
Sodium: 143 mEq/L (ref 135–145)
Sodium: 139 mmol/L (ref 135–145)

## 2020-09-16 MED ORDER — SODIUM POLYSTYRENE SULFONATE 15 GM/60ML PO SUSP: 30.0000 g | Freq: Once | ORAL | 0 refills | Status: AC

## 2020-09-18 ENCOUNTER — Other Ambulatory Visit: Payer: Self-pay | Admitting: Family

## 2020-09-18 ENCOUNTER — Encounter: Payer: Self-pay | Admitting: Family

## 2020-09-18 DIAGNOSIS — Z0000 Encounter for general adult medical examination without abnormal findings: Secondary | ICD-10-CM

## 2020-09-18 DIAGNOSIS — D721 Eosinophilia, unspecified: Secondary | ICD-10-CM

## 2020-09-18 DIAGNOSIS — M109 Gout, unspecified: Secondary | ICD-10-CM

## 2020-09-18 MED ORDER — ALLOPURINOL 100 MG PO TABS: 300.0000 mg | ORAL_TABLET | Freq: Every day | ORAL | 1 refills | Status: DC

## 2020-10-10 DIAGNOSIS — D721 Eosinophilia, unspecified: Secondary | ICD-10-CM | POA: Insufficient documentation

## 2020-10-10 NOTE — Progress Notes (Signed)
[facility]  [phone] [phone]  ID: Alfredia Ferguson OB: [birth year]  MR#: [id number]  LPF#:[id number]  Patient Care Team: Burnard Hawthorne, FNP as PCP - General (Family Medicine)  CHIEF COMPLAINT: Eosinophilia, unspecified.  INTERVAL HISTORY: Patient is a 60 year old male who was noted to have a persistently elevated eosinophil count on routine blood work.  He currently feels well and is asymptomatic.  He has seasonal allergies.  He denies any new medications.  He has no neurologic complaints.  He denies any recent fevers or illnesses.  He has a good appetite and denies weight loss.  He has no chest pain, shortness of breath, cough, or hemoptysis.  He denies any nausea, vomiting, constipation, or diarrhea.  He has no urinary complaints.  Patient feels at his baseline and offers no specific complaints today.  REVIEW OF SYSTEMS:   Review of Systems  Constitutional: Negative.  Negative for fever, malaise/fatigue and weight loss.  Respiratory: Negative.  Negative for cough, hemoptysis and shortness of breath.   Cardiovascular: Negative.  Negative for chest pain and leg swelling.  Gastrointestinal: Negative.  Negative for abdominal pain.  Genitourinary: Negative.  Negative for dysuria.  Musculoskeletal: Negative.  Negative for back pain.  Skin: Negative.  Negative for rash.  Neurological: Negative for dizziness, focal weakness, weakness and headaches.  Psychiatric/Behavioral: Negative.  The patient is not nervous/anxious.     As per HPI. Otherwise, a complete review of systems is negative.  PAST MEDICAL HISTORY: Past Medical History:  Diagnosis Date  . Allergy    Hay fever  . Anal fissure   . Arthritis   . Back pain, chronic    abnormality L4-5, per pt  . Blood in stool   . Colon polyp   . Kidney stones     PAST SURGICAL HISTORY: Past Surgical History:  Procedure Laterality Date  . NOSE SURGERY      FAMILY HISTORY: Family History   Problem Relation Age of Onset  . Colon cancer Mother   . Hypertension Mother   . Hyperlipidemia Father   . Heart disease Father   . Hypertension Father   . Diabetes Father   . Rheum arthritis Sister   . Arthritis Maternal Grandmother   . Hyperlipidemia Maternal Grandmother   . Hypertension Maternal Grandmother   . Heart disease Paternal Grandfather     ADVANCED DIRECTIVES (Y/N):  N  HEALTH MAINTENANCE: Social History   Tobacco Use  . Smoking status: Never Smoker  . Smokeless tobacco: Never Used  Vaping Use  . Vaping Use: Never used  Substance Use Topics  . Alcohol use: Yes    Alcohol/week: 8.0 standard drinks    Types: 8 drink(s) per week    Comment: social  . Drug use: No     Colonoscopy:  PAP:  Bone density:  Lipid panel:  No Known Allergies  Current Outpatient Medications  Medication Sig Dispense Refill  . allopurinol (ZYLOPRIM) 100 MG tablet Take 3 tablets (300 mg total) by mouth daily. 120 tablet 1  . amLODipine (NORVASC) 5 MG tablet TAKE 1 TABLET DAILY 90 tablet 3  . ascorbic acid (VITAMIN C) 1000 MG tablet Take 1 tablet by mouth daily.    . carvedilol (COREG) 6.25 MG tablet TAKE 1 TABLET TWICE A DAY WITH MEALS 180 tablet 0  . Cholecalciferol 25 MCG (1000 UT) tablet Take 1 tablet by mouth daily.    . hydrALAZINE (APRESOLINE) 10 MG tablet TAKE 1 TABLET THREE TIMES A DAY (Patient  taking differently: Take 10 mg by mouth 2 (two) times daily.) 270 tablet 3  . loratadine (CLARITIN) 10 MG tablet TAKE 1 TABLET DAILY 90 tablet 3  . meloxicam (MOBIC) 15 MG tablet Take 1 tablet by mouth daily.     No current facility-administered medications for this visit.    OBJECTIVE: Vitals:   10/17/20 0955  BP: (!) 143/90  Pulse: 66  Resp: 16  Temp: (!) 96.5 F (35.8 C)  SpO2: 100%     Body mass index is 27.68 kg/m.    ECOG FS:0 - Asymptomatic  General: Well-developed, well-nourished, no acute distress. Eyes: Pink conjunctiva, anicteric sclera. HEENT:  Normocephalic, moist mucous membranes. Lungs: No audible wheezing or coughing. Heart: Regular rate and rhythm. Abdomen: Soft, nontender, no obvious distention. Musculoskeletal: No edema, cyanosis, or clubbing. Neuro: Alert, answering all questions appropriately. Cranial nerves grossly intact. Skin: No rashes or petechiae noted. Psych: Normal affect. Lymphatics: No cervical, calvicular, axillary or inguinal LAD.   LAB RESULTS:  Lab Results  Component Value Date   NA 141 10/17/2020   K 4.5 10/17/2020   CL 108 10/17/2020   CO2 24 10/17/2020   GLUCOSE 106 (H) 10/17/2020   BUN 21 (H) 10/17/2020   CREATININE 0.90 10/17/2020   CALCIUM 8.8 (L) 10/17/2020   PROT 7.1 10/17/2020   ALBUMIN 4.1 10/17/2020   AST 21 10/17/2020   ALT 23 10/17/2020   ALKPHOS 41 10/17/2020   BILITOT 0.7 10/17/2020   GFRNONAA >60 10/17/2020   GFRAA 87 11/10/2019    Lab Results  Component Value Date   WBC 5.4 10/17/2020   NEUTROABS 2.9 10/17/2020   HGB 15.1 10/17/2020   HCT 45.1 10/17/2020   MCV 91.5 10/17/2020   PLT 212 10/17/2020     STUDIES: No results found.  ASSESSMENT: Eosinophilia, unspecified.  PLAN:    1. Eosinophilia, unspecified: Resolved.  Patient has a normal CBC as well as eosinophil count on today's laboratory work.  Complete metabolic panel is essentially within normal limits.  Folate is within normal limits. B12 level and peripheral blood flow cytometry are pending at time of dictation.  No need to assess tryptase levels given patient is asymptomatic.  No intervention is needed.  Patient does not require bone marrow biopsy.  Patient will have a video assisted telemedicine visit in approximately 2 weeks to discuss his laboratory results.  I spent a total of 45 minutes reviewing chart data, face-to-face evaluation with the patient, counseling and coordination of care as detailed above.   Patient expressed understanding and was in agreement with this plan. He also understands that He  can call clinic at any time with any questions, concerns, or complaints.    Lloyd Huger, MD   10/17/2020 2:19 PM

## 2020-10-17 ENCOUNTER — Encounter: Payer: Self-pay | Admitting: Oncology

## 2020-10-17 ENCOUNTER — Inpatient Hospital Stay: Payer: Managed Care, Other (non HMO) | Attending: Oncology | Admitting: Oncology

## 2020-10-17 ENCOUNTER — Inpatient Hospital Stay: Payer: Managed Care, Other (non HMO)

## 2020-10-17 DIAGNOSIS — D721 Eosinophilia, unspecified: Secondary | ICD-10-CM

## 2020-10-17 LAB — CBC WITH DIFFERENTIAL/PLATELET
Abs Immature Granulocytes: 0.01 10*3/uL (ref 0.00–0.07)
Basophils Absolute: 0 10*3/uL (ref 0.0–0.1)
Basophils Relative: 1 %
Eosinophils Absolute: 0.5 10*3/uL (ref 0.0–0.5)
Eosinophils Relative: 9 %
HCT: 45.1 % (ref 39.0–52.0)
Hemoglobin: 15.1 g/dL (ref 13.0–17.0)
Immature Granulocytes: 0 %
Lymphocytes Relative: 28 %
Lymphs Abs: 1.5 10*3/uL (ref 0.7–4.0)
MCH: 30.6 pg (ref 26.0–34.0)
MCHC: 33.5 g/dL (ref 30.0–36.0)
MCV: 91.5 fL (ref 80.0–100.0)
Monocytes Absolute: 0.4 10*3/uL (ref 0.1–1.0)
Monocytes Relative: 7 %
Neutro Abs: 2.9 10*3/uL (ref 1.7–7.7)
Neutrophils Relative %: 55 %
Platelets: 212 10*3/uL (ref 150–400)
RBC: 4.93 MIL/uL (ref 4.22–5.81)
RDW: 13.1 % (ref 11.5–15.5)
WBC: 5.4 10*3/uL (ref 4.0–10.5)
nRBC: 0 % (ref 0.0–0.2)

## 2020-10-17 LAB — COMPREHENSIVE METABOLIC PANEL
ALT: 23 U/L (ref 0–44)
AST: 21 U/L (ref 15–41)
Albumin: 4.1 g/dL (ref 3.5–5.0)
Alkaline Phosphatase: 41 U/L (ref 38–126)
Anion gap: 9 (ref 5–15)
BUN: 21 mg/dL — ABNORMAL HIGH (ref 6–20)
CO2: 24 mmol/L (ref 22–32)
Calcium: 8.8 mg/dL — ABNORMAL LOW (ref 8.9–10.3)
Chloride: 108 mmol/L (ref 98–111)
Creatinine, Ser: 0.9 mg/dL (ref 0.61–1.24)
GFR, Estimated: >60 mL/min (ref >60)
Glucose, Bld: 106 mg/dL — ABNORMAL HIGH (ref 70–99)
Potassium: 4.5 mmol/L (ref 3.5–5.1)
Sodium: 141 mmol/L (ref 135–145)
Total Bilirubin: 0.7 mg/dL (ref 0.3–1.2)
Total Protein: 7.1 g/dL (ref 6.5–8.1)

## 2020-10-17 LAB — FOLATE: Folate: 17.5 ng/mL (ref >5.9)

## 2020-10-17 LAB — VITAMIN B12: Vitamin B-12: 394 pg/mL (ref 180–914)

## 2020-10-17 NOTE — Progress Notes (Signed)
Patient here today for initial evaluation regarding abnormal labs. Patient denies any concerns today.

## 2020-10-19 LAB — TRYPTASE: Tryptase: 4.3 ug/L (ref 2.2–13.2)

## 2020-10-21 ENCOUNTER — Other Ambulatory Visit: Payer: Self-pay | Admitting: Family

## 2020-10-21 DIAGNOSIS — I10 Essential (primary) hypertension: Secondary | ICD-10-CM

## 2020-10-21 LAB — COMP PANEL: LEUKEMIA/LYMPHOMA

## 2020-10-27 NOTE — Progress Notes (Deleted)
[place]  [phone] [phone]  ID: Tony Scott OB: Dec 20, 1960  MR#: [id number]  POE#:[id number]  Patient Care Team: Burnard Hawthorne, FNP as PCP - General (Family Medicine)  I connected with Tony Scott on 10/27/20 at  3:00 PM EDT by {Blank single:19197::"video enabled telemedicine visit","telephone visit"} and verified that I am speaking with the correct person using two identifiers.   I discussed the limitations, risks, security and privacy concerns of performing an evaluation and management service by telemedicine and the availability of in-person appointments. I also discussed with the patient that there may be a patient responsible charge related to this service. The patient expressed understanding and agreed to proceed.   Other persons participating in the visit and their role in the encounter: Patient, MD.  Patient's location: Home. Provider's location: Clinic.  CHIEF COMPLAINT: Eosinophilia, unspecified.  INTERVAL HISTORY: Patient is a 60 year old male who was noted to have a persistently elevated eosinophil count on routine blood work.  He currently feels well and is asymptomatic.  He has seasonal allergies.  He denies any new medications.  He has no neurologic complaints.  He denies any recent fevers or illnesses.  He has a good appetite and denies weight loss.  He has no chest pain, shortness of breath, cough, or hemoptysis.  He denies any nausea, vomiting, constipation, or diarrhea.  He has no urinary complaints.  Patient feels at his baseline and offers no specific complaints today.  REVIEW OF SYSTEMS:   Review of Systems  Constitutional: Negative.  Negative for fever, malaise/fatigue and weight loss.  Respiratory: Negative.  Negative for cough, hemoptysis and shortness of breath.   Cardiovascular: Negative.  Negative for chest pain and leg swelling.  Gastrointestinal: Negative.  Negative for abdominal pain.  Genitourinary:  Negative.  Negative for dysuria.  Musculoskeletal: Negative.  Negative for back pain.  Skin: Negative.  Negative for rash.  Neurological: Negative for dizziness, focal weakness, weakness and headaches.  Psychiatric/Behavioral: Negative.  The patient is not nervous/anxious.     As per HPI. Otherwise, a complete review of systems is negative.  PAST MEDICAL HISTORY: Past Medical History:  Diagnosis Date  . Allergy    Hay fever  . Anal fissure   . Arthritis   . Back pain, chronic    abnormality L4-5, per pt  . Blood in stool   . Colon polyp   . Kidney stones     PAST SURGICAL HISTORY: Past Surgical History:  Procedure Laterality Date  . NOSE SURGERY      FAMILY HISTORY: Family History  Problem Relation Age of Onset  . Colon cancer Mother   . Hypertension Mother   . Hyperlipidemia Father   . Heart disease Father   . Hypertension Father   . Diabetes Father   . Rheum arthritis Sister   . Arthritis Maternal Grandmother   . Hyperlipidemia Maternal Grandmother   . Hypertension Maternal Grandmother   . Heart disease Paternal Grandfather     ADVANCED DIRECTIVES (Y/N):  N  HEALTH MAINTENANCE: Social History   Tobacco Use  . Smoking status: Never Smoker  . Smokeless tobacco: Never Used  Vaping Use  . Vaping Use: Never used  Substance Use Topics  . Alcohol use: Yes    Alcohol/week: 8.0 standard drinks    Types: 8 drink(s) per week    Comment: social  . Drug use: No     Colonoscopy:  PAP:  Bone density:  Lipid panel:  No Known  Allergies  Current Outpatient Medications  Medication Sig Dispense Refill  . allopurinol (ZYLOPRIM) 100 MG tablet Take 3 tablets (300 mg total) by mouth daily. 120 tablet 1  . amLODipine (NORVASC) 5 MG tablet TAKE 1 TABLET DAILY 90 tablet 3  . ascorbic acid (VITAMIN C) 1000 MG tablet Take 1 tablet by mouth daily.    . carvedilol (COREG) 6.25 MG tablet TAKE 1 TABLET TWICE A DAY WITH MEALS 180 tablet 3  . Cholecalciferol 25 MCG (1000  UT) tablet Take 1 tablet by mouth daily.    . hydrALAZINE (APRESOLINE) 10 MG tablet TAKE 1 TABLET THREE TIMES A DAY (Patient taking differently: Take 10 mg by mouth 2 (two) times daily.) 270 tablet 3  . loratadine (CLARITIN) 10 MG tablet TAKE 1 TABLET DAILY 90 tablet 3  . meloxicam (MOBIC) 15 MG tablet Take 1 tablet by mouth daily.     No current facility-administered medications for this visit.    OBJECTIVE: There were no vitals filed for this visit.   There is no height or weight on file to calculate BMI.    ECOG FS:0 - Asymptomatic  General: Well-developed, well-nourished, no acute distress. Eyes: Pink conjunctiva, anicteric sclera. HEENT: Normocephalic, moist mucous membranes. Lungs: No audible wheezing or coughing. Heart: Regular rate and rhythm. Abdomen: Soft, nontender, no obvious distention. Musculoskeletal: No edema, cyanosis, or clubbing. Neuro: Alert, answering all questions appropriately. Cranial nerves grossly intact. Skin: No rashes or petechiae noted. Psych: Normal affect. Lymphatics: No cervical, calvicular, axillary or inguinal LAD.   LAB RESULTS:  Lab Results  Component Value Date   NA 141 10/17/2020   K 4.5 10/17/2020   CL 108 10/17/2020   CO2 24 10/17/2020   GLUCOSE 106 (H) 10/17/2020   BUN 21 (H) 10/17/2020   CREATININE 0.90 10/17/2020   CALCIUM 8.8 (L) 10/17/2020   PROT 7.1 10/17/2020   ALBUMIN 4.1 10/17/2020   AST 21 10/17/2020   ALT 23 10/17/2020   ALKPHOS 41 10/17/2020   BILITOT 0.7 10/17/2020   GFRNONAA >60 10/17/2020   GFRAA 87 11/10/2019    Lab Results  Component Value Date   WBC 5.4 10/17/2020   NEUTROABS 2.9 10/17/2020   HGB 15.1 10/17/2020   HCT 45.1 10/17/2020   MCV 91.5 10/17/2020   PLT 212 10/17/2020     STUDIES: No results found.  ASSESSMENT: Eosinophilia, unspecified.  PLAN:    1. Eosinophilia, unspecified: Resolved.  Patient has a normal CBC as well as eosinophil count on today's laboratory work.  Complete metabolic  panel is essentially within normal limits.  Folate is within normal limits. B12 level and peripheral blood flow cytometry are pending at time of dictation.  No need to assess tryptase levels given patient is asymptomatic.  No intervention is needed.  Patient does not require bone marrow biopsy.  Patient will have a video assisted telemedicine visit in approximately 2 weeks to discuss his laboratory results.  I provided *** minutes of {Blank single:19197::"face-to-face video visit time","non face-to-face telephone visit time"} during this encounter which included chart review, counseling, and coordination of care as documented above.   Patient expressed understanding and was in agreement with this plan. He also understands that He can call clinic at any time with any questions, concerns, or complaints.    Lloyd Huger, MD   10/27/2020 10:20 PM

## 2020-10-31 ENCOUNTER — Inpatient Hospital Stay: Payer: Managed Care, Other (non HMO) | Admitting: Oncology

## 2020-10-31 DIAGNOSIS — D721 Eosinophilia, unspecified: Secondary | ICD-10-CM

## 2020-11-08 ENCOUNTER — Inpatient Hospital Stay (HOSPITAL_BASED_OUTPATIENT_CLINIC_OR_DEPARTMENT_OTHER): Payer: Managed Care, Other (non HMO) | Admitting: Nurse Practitioner

## 2020-11-08 DIAGNOSIS — D721 Eosinophilia, unspecified: Secondary | ICD-10-CM

## 2020-11-08 NOTE — Progress Notes (Signed)
[place]  Telephone:([phone] [phone]  Virtual Visit Progress Note  I connected with Tony Scott on 11/08/20 at  1:45 PM EDT by video enabled telemedicine visit and verified that I am speaking with the correct person using two identifiers.   I discussed the limitations, risks, security and privacy concerns of performing an evaluation and management service by telemedicine and the availability of in-person appointments. I also discussed with the patient that there may be a patient responsible charge related to this service. The patient expressed understanding and agreed to proceed.   Other persons participating in the visit and their role in the encounter: none  Patient's location: home Provider's location: home  Chief Complaint: Eosinophilia  ID: Tony Scott OB: 02-21-1961  MR#: [id number]  JGG#:[id number]  Patient Care Team: Burnard Hawthorne, FNP as PCP - General (Family Medicine)  CHIEF COMPLAINT: Eosinophilia, unspecified.  INTERVAL HISTORY: Patient is 60 year old male who returns to clinic for discussion of blood work for persistent eosinophilia. He continues to feel well and is asymptomatic. He has allergies year round for which he takes claritin daily. No neurologic complaints. No fevers or illnesses. Appetite is stable, denies weight loss. No chest pain, shortness of breath, cough, or hemoptysis. No nausea, vomiting, constipation, or diarrhea. No urinary complaints. He feels at baseline and denies other specific complaints today.   REVIEW OF SYSTEMS:   Review of Systems  Constitutional: Negative.  Negative for fever, malaise/fatigue and weight loss.  Respiratory: Negative.  Negative for cough, hemoptysis and shortness of breath.   Cardiovascular: Negative.  Negative for chest pain and leg swelling.  Gastrointestinal: Negative.  Negative for abdominal pain.  Genitourinary: Negative.  Negative for dysuria.  Musculoskeletal: Negative.   Negative for back pain.  Skin: Negative.  Negative for rash.  Neurological: Negative for dizziness, focal weakness, weakness and headaches.  Psychiatric/Behavioral: Negative.  The patient is not nervous/anxious.   As per HPI. Otherwise, a complete review of systems is negative.  PAST MEDICAL HISTORY: Past Medical History:  Diagnosis Date  . Allergy    Hay fever  . Anal fissure   . Arthritis   . Back pain, chronic    abnormality L4-5, per pt  . Blood in stool   . Colon polyp   . Kidney stones     PAST SURGICAL HISTORY: Past Surgical History:  Procedure Laterality Date  . NOSE SURGERY      FAMILY HISTORY: Family History  Problem Relation Age of Onset  . Colon cancer Mother   . Hypertension Mother   . Hyperlipidemia Father   . Heart disease Father   . Hypertension Father   . Diabetes Father   . Rheum arthritis Sister   . Arthritis Maternal Grandmother   . Hyperlipidemia Maternal Grandmother   . Hypertension Maternal Grandmother   . Heart disease Paternal Grandfather     ADVANCED DIRECTIVES (Y/N):  N  HEALTH MAINTENANCE: Social History   Tobacco Use  . Smoking status: Never Smoker  . Smokeless tobacco: Never Used  Vaping Use  . Vaping Use: Never used  Substance Use Topics  . Alcohol use: Yes    Alcohol/week: 8.0 standard drinks    Types: 8 drink(s) per week    Comment: social  . Drug use: No    Colonoscopy:  Lipid panel:  No Known Allergies  Current Outpatient Medications  Medication Sig Dispense Refill  . allopurinol (ZYLOPRIM) 100 MG tablet Take 3 tablets (300 mg total) by  mouth daily. 120 tablet 1  . amLODipine (NORVASC) 5 MG tablet TAKE 1 TABLET DAILY 90 tablet 3  . ascorbic acid (VITAMIN C) 1000 MG tablet Take 1 tablet by mouth daily.    . carvedilol (COREG) 6.25 MG tablet TAKE 1 TABLET TWICE A DAY WITH MEALS 180 tablet 3  . Cholecalciferol 25 MCG (1000 UT) tablet Take 1 tablet by mouth daily.    . hydrALAZINE (APRESOLINE) 10 MG tablet TAKE 1  TABLET THREE TIMES A DAY (Patient taking differently: Take 10 mg by mouth 2 (two) times daily.) 270 tablet 3  . loratadine (CLARITIN) 10 MG tablet TAKE 1 TABLET DAILY 90 tablet 3  . meloxicam (MOBIC) 15 MG tablet Take 1 tablet by mouth daily.     No current facility-administered medications for this visit.    OBJECTIVE: Exam limited due to telemedicine There were no vitals filed for this visit.   There is no height or weight on file to calculate BMI.    ECOG FS:0 - Asymptomatic  General: Well-developed, well-nourished, no acute distress. Lungs: No audible wheezing or coughing Neuro: Alert, answering all questions appropriately.  Psych: Normal affect.   LAB RESULTS:  Lab Results  Component Value Date   NA 141 10/17/2020   K 4.5 10/17/2020   CL 108 10/17/2020   CO2 24 10/17/2020   GLUCOSE 106 (H) 10/17/2020   BUN 21 (H) 10/17/2020   CREATININE 0.90 10/17/2020   CALCIUM 8.8 (L) 10/17/2020   PROT 7.1 10/17/2020   ALBUMIN 4.1 10/17/2020   AST 21 10/17/2020   ALT 23 10/17/2020   ALKPHOS 41 10/17/2020   BILITOT 0.7 10/17/2020   GFRNONAA >60 10/17/2020   GFRAA 87 11/10/2019    Lab Results  Component Value Date   WBC 5.4 10/17/2020   NEUTROABS 2.9 10/17/2020   HGB 15.1 10/17/2020   HCT 45.1 10/17/2020   MCV 91.5 10/17/2020   PLT 212 10/17/2020   Tryptase- 4.3 (normal)  Folate - 17.5 (normal)  Vitamin B12- 394 (normal)  Flow cytometry panel: no significant immunophenotypic abnormality detected. No monoclonal B cell population is detected. kappa:lambda ratio 1.8. There is no loss of, or aberrant expression of, the pan T cell antigens to suggest a neoplastic T cell process.  CD4:CD8 ratio 1.3. No circulating blasts are detected. There is no immunophenotypic evidence of abnormal myeloid maturation. Eosinophils are increased and account for approximately 10% of leukocytes. Analysis of the leukocyte population shows: granulocytes 73% (including eosinophils 10%), monocytes 5%,  lymphocytes 22%, blasts <0.1%, B  cells 2%, T cells 19%, NK cells 1%.  STUDIES: No results found.  ASSESSMENT: Eosinophilia, unspecified.  PLAN:    1. Eosinophilia, unspecified: Repeat cbc revealed normal eosinophil count. CMP, folate, b12, and peripheral blood flow cytometry were normal. Tryptase normal. Do not recommend bone marrow biopsy at this time. No further intervention needed at this time as he remains asymptomatic and eosinophilia has resolved. Question if related to underlying allergies. Patient does not require follow up at this time. If he becomes symptomatic or bloodwork becomes abnormal we can see him back.   I discussed the assessment and treatment plan with the patient. The patient was provided an opportunity to ask questions and all were answered. The patient agreed with the plan and demonstrated an understanding of the instructions.   The patient was advised to call back or seek an in-person evaluation if the symptoms worsen or if the condition fails to improve as anticipated.  I spent 15 minutes face-to-face  video visit time dedicated to the care of this patient on the date of this encounter to include pre-visit review of medical oncology notes, labs, face-to-face time with the patient, and post visit ordering of testing/documentation.   Verlon Au, NP   11/08/2020 2:58 PM

## 2021-02-22 IMAGING — CR DG LUMBAR SPINE 2-3V
1 series · 3 of 3 positions shown · non-contrast
Comparison: 03/29/2012

CLINICAL DATA: Chronic low back pain acutely worse.

EXAM:
LUMBAR SPINE - 2-3 VIEW

[Series 1: dg lumbar spine 2-3 views · 0.14mm/px · 3 of 3 slices shown]
[im 1/3]
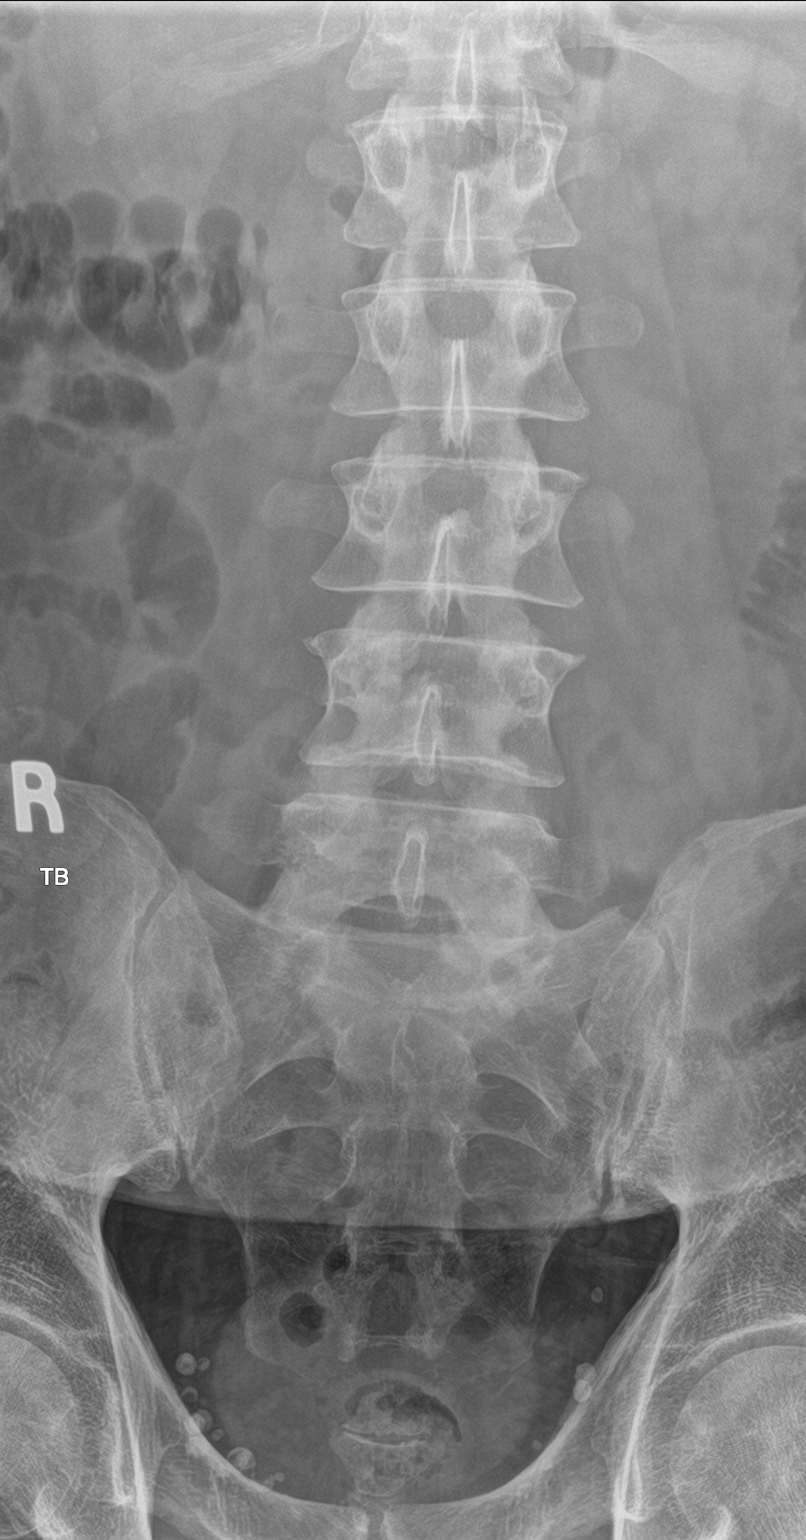
[im 2/3]
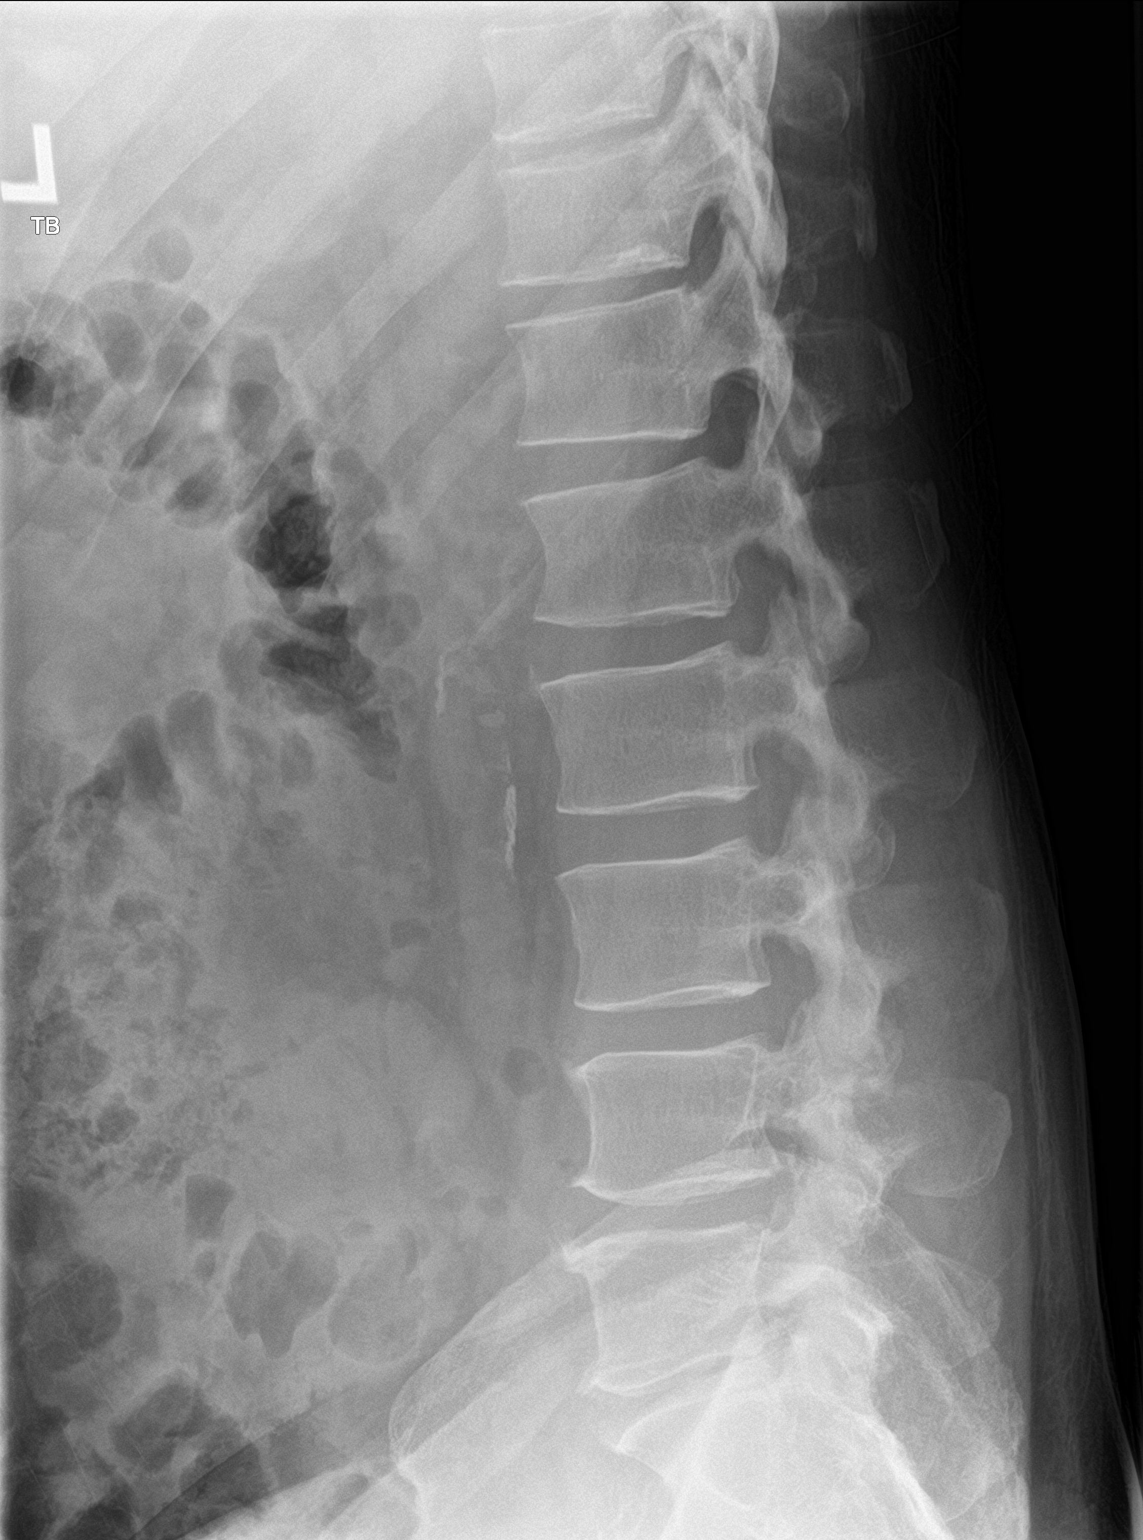
[im 3/3]
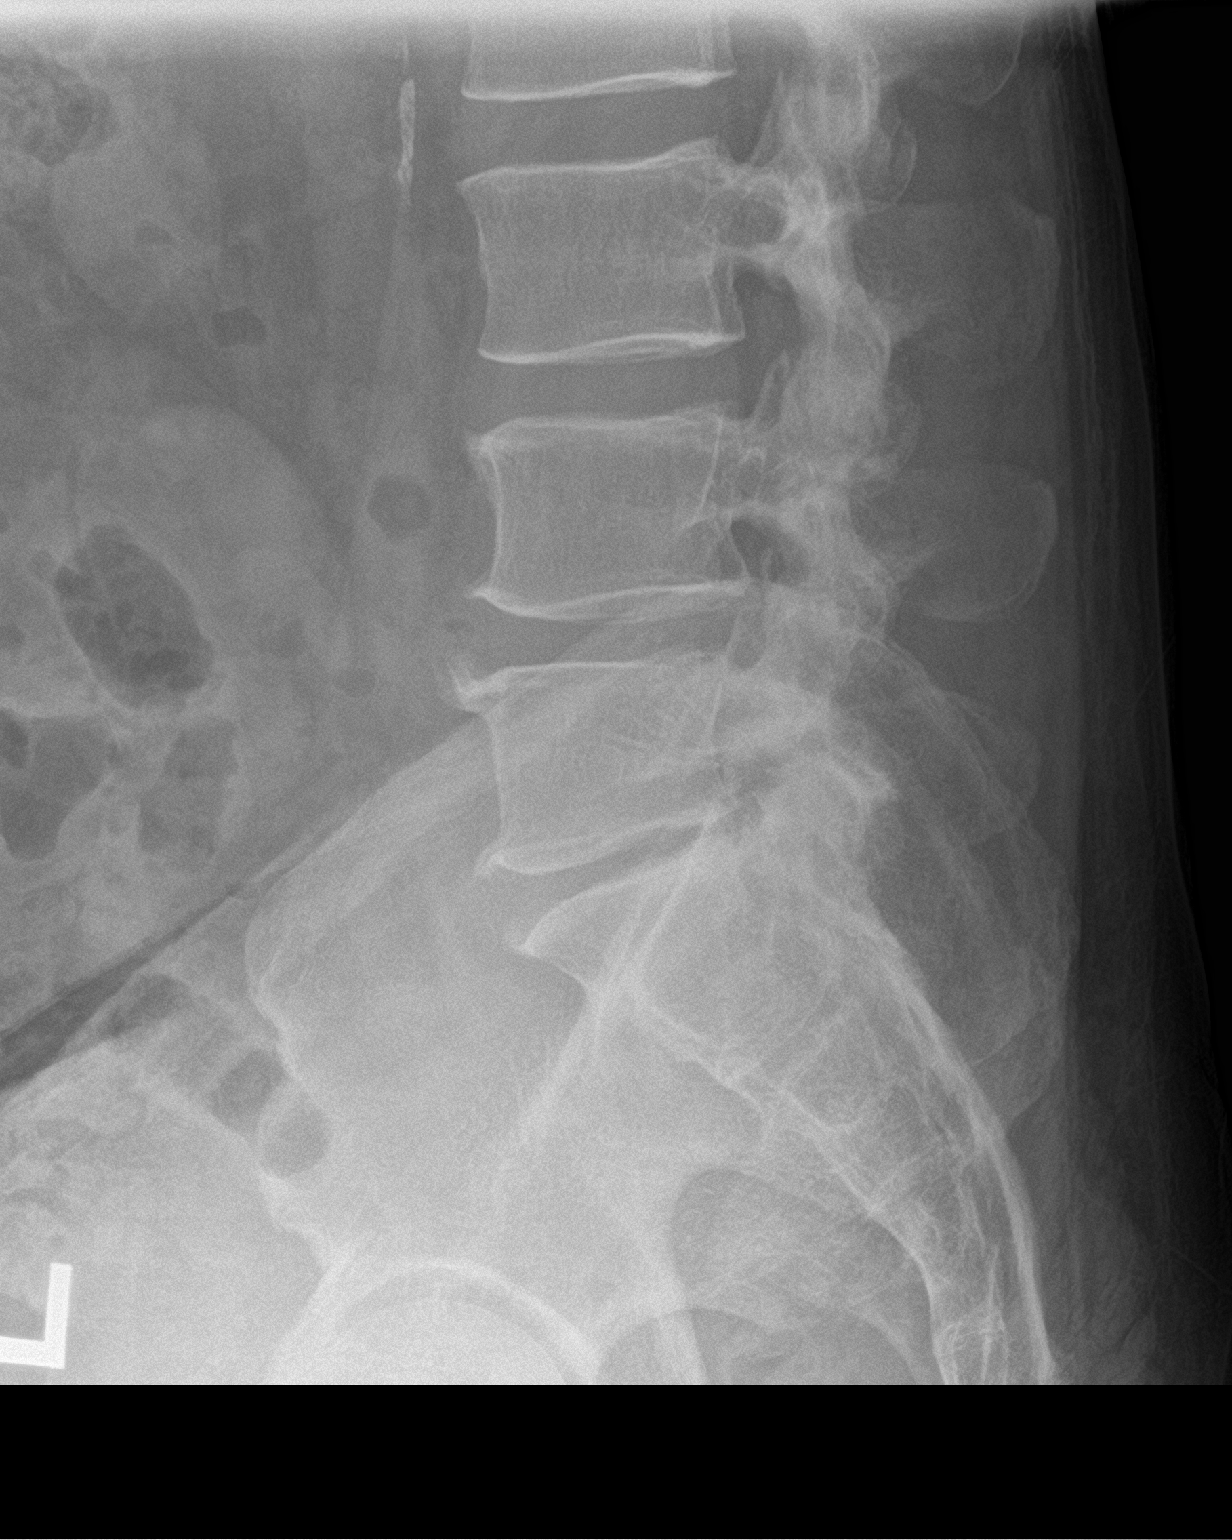

[3 of 3 positions shown; findings below may reference images not displayed]

FINDINGS: Vertebral body alignment and heights are normal. There is mild
spondylosis throughout the lumbar spine to include moderate facet
arthropathy over the mid to lower lumbar spine. No evidence of
compression fracture or spondylolisthesis. Disc space narrowing at
the L4-5 and L5-S1 levels without significant change.
IMPRESSION: 1.  No acute findings.

2. Mild spondylosis of the lumbar spine to include moderate facet
arthropathy with disc disease at the L4-5 and L5-S1 levels without
significant change.

## 2021-03-10 ENCOUNTER — Ambulatory Visit: Payer: Managed Care, Other (non HMO) | Admitting: Family

## 2021-04-11 ENCOUNTER — Other Ambulatory Visit: Payer: Self-pay

## 2021-04-11 ENCOUNTER — Ambulatory Visit: Payer: Managed Care, Other (non HMO) | Admitting: Family

## 2021-04-11 ENCOUNTER — Encounter: Payer: Self-pay | Admitting: Family

## 2021-04-11 VITALS — BP 110/82 | HR 62 | Temp 98.0°F | Ht 72.0 in | Wt 208.2 lb

## 2021-04-11 DIAGNOSIS — M109 Gout, unspecified: Secondary | ICD-10-CM | POA: Diagnosis not present

## 2021-04-11 DIAGNOSIS — I10 Essential (primary) hypertension: Secondary | ICD-10-CM | POA: Diagnosis not present

## 2021-04-11 MED ORDER — CARVEDILOL 6.25 MG PO TABS: 6.2500 mg | ORAL_TABLET | Freq: Two times a day (BID) | ORAL | 3 refills | Status: DC

## 2021-04-11 MED ORDER — HYDRALAZINE HCL 10 MG PO TABS: 10.0000 mg | ORAL_TABLET | Freq: Two times a day (BID) | ORAL | 1 refills | Status: DC

## 2021-04-11 MED ORDER — AMLODIPINE BESYLATE 5 MG PO TABS: 5.0000 mg | ORAL_TABLET | Freq: Every day | ORAL | 3 refills | Status: DC

## 2021-04-11 NOTE — Progress Notes (Signed)
Subjective:    Patient ID: Tony Scott, male    DOB: Jun 19, 1961, 60 y.o.   MRN: [id number]  CC: Tony Scott is a 60 y.o. male who presents today for follow up.   HPI: Feels well today.  No complaints   hypertension-compliant with Coreg 6.25 mg twice a day, amlodipine 5 mg, hydralazine 10 mg twice daily . No cp, sob. Leg swelling resolved.   Gout -he remains compliant with allopurinol 300 mg.  No gout flares in several months.  had consult 11/08/20 with [facility] hematology for eosinophilia.  Eosinophilia normalized.  Did not recommend bone marrow biopsy.  No follow-up required   HISTORY:  Past Medical History:  Diagnosis Date   Allergy    Hay fever   Anal fissure    Arthritis    Back pain, chronic    abnormality L4-5, per pt   Blood in stool    Colon polyp    Kidney stones    Past Surgical History:  Procedure Laterality Date   NOSE SURGERY     Family History  Problem Relation Age of Onset   Colon cancer Mother    Hypertension Mother    Hyperlipidemia Father    Heart disease Father    Hypertension Father    Diabetes Father    Rheum arthritis Sister    Arthritis Maternal Grandmother    Hyperlipidemia Maternal Grandmother    Hypertension Maternal Grandmother    Heart disease Paternal Grandfather     Allergies: Patient has no known allergies. Current Outpatient Medications on File Prior to Visit  Medication Sig Dispense Refill   allopurinol (ZYLOPRIM) 100 MG tablet Take 3 tablets (300 mg total) by mouth daily. 120 tablet 1   ascorbic acid (VITAMIN C) 1000 MG tablet Take 1 tablet by mouth daily.     Cholecalciferol 25 MCG (1000 UT) tablet Take 1 tablet by mouth daily.     loratadine (CLARITIN) 10 MG tablet TAKE 1 TABLET DAILY 90 tablet 3   meloxicam (MOBIC) 15 MG tablet Take 1 tablet by mouth daily.     vitamin E 1000 UNIT capsule Take 1,000 Units by mouth daily.     No current facility-administered medications on file prior to visit.    Social History    Tobacco Use   Smoking status: Never   Smokeless tobacco: Never  Vaping Use   Vaping Use: Never used  Substance Use Topics   Alcohol use: Yes    Alcohol/week: 8.0 standard drinks    Types: 8 drink(s) per week    Comment: social   Drug use: No    Review of Systems  Constitutional:  Negative for chills and fever.  HENT:  Negative for congestion, ear pain, rhinorrhea, sinus pressure and sore throat.   Respiratory:  Negative for cough, shortness of breath and wheezing.   Cardiovascular:  Negative for chest pain, palpitations and leg swelling.  Gastrointestinal:  Negative for diarrhea, nausea and vomiting.  Genitourinary:  Negative for dysuria.  Musculoskeletal:  Negative for myalgias.  Skin:  Negative for rash.  Neurological:  Negative for headaches.  Hematological:  Negative for adenopathy.     Objective:    BP 110/82   Pulse 62   Temp 98 F (36.7 C) (Oral)   Ht 6' (1.829 m)   Wt 208 lb 3.2 oz (94.4 kg)   SpO2 99%   BMI 28.24 kg/m  BP Readings from Last 3 Encounters:  04/11/21 110/82  10/17/20 (!) 143/90  09/09/20 132/78   Wt Readings from Last 3 Encounters:  04/11/21 208 lb 3.2 oz (94.4 kg)  10/17/20 204 lb 1.6 oz (92.6 kg)  09/09/20 205 lb 9.6 oz (93.3 kg)    Physical Exam Vitals reviewed.  Constitutional:      Appearance: He is well-developed.  Cardiovascular:     Rate and Rhythm: Regular rhythm.     Heart sounds: Normal heart sounds.  Pulmonary:     Effort: Pulmonary effort is normal. No respiratory distress.     Breath sounds: Normal breath sounds. No wheezing, rhonchi or rales.  Skin:    General: Skin is warm and dry.  Neurological:     Mental Status: He is alert.  Psychiatric:        Speech: Speech normal.        Behavior: Behavior normal.       Assessment & Plan:   Problem List Items Addressed This Visit       Cardiovascular and Mediastinum   HTN (hypertension)    Chronic, stable. continue Coreg 6.25 mg twice a day, amlodipine 5 mg,  hydralazine 10 mg twice daily .  Counseled patient again that hydralazine 10 mg continue to be taken 3 times daily however this is difficult for him to remember this third dose.  Pleased to see however blood pressure is well controlled and we can continue this for now with close vigilance; if blood pressure were to elevate again,we  would need to address either being more consistent with hydralazine 10 mg 3 times daily or changing to another agent      Relevant Medications   amLODipine (NORVASC) 5 MG tablet   carvedilol (COREG) 6.25 MG tablet   hydrALAZINE (APRESOLINE) 10 MG tablet     Other   Gout    Chronic, stable continue allopurinol 300 mg      Other Visit Diagnoses     Essential hypertension    -  Primary   Relevant Medications   amLODipine (NORVASC) 5 MG tablet   carvedilol (COREG) 6.25 MG tablet   hydrALAZINE (APRESOLINE) 10 MG tablet   Other Relevant Orders   Comprehensive metabolic panel      Declines colonoscopy. He is not ready to schedule.   I am having Elster L. Koors maintain his ascorbic acid, Cholecalciferol, loratadine, allopurinol, meloxicam, vitamin E, amLODipine, carvedilol, and hydrALAZINE.   Meds ordered this encounter  Medications   DISCONTD: amLODipine (NORVASC) 5 MG tablet    Sig: Take 1 tablet (5 mg total) by mouth daily.    Dispense:  90 tablet    Refill:  3    Order Specific Question:   Supervising Provider    Answer:   Crecencio Mc [[id number]]   DISCONTD: carvedilol (COREG) 6.25 MG tablet    Sig: Take 1 tablet (6.25 mg total) by mouth 2 (two) times daily with a meal.    Dispense:  180 tablet    Refill:  3    Order Specific Question:   Supervising Provider    Answer:   Crecencio Mc [[id number]]   DISCONTD: hydrALAZINE (APRESOLINE) 10 MG tablet    Sig: Take 1 tablet (10 mg total) by mouth 2 (two) times daily.    Dispense:  120 tablet    Refill:  1    Order Specific Question:   Supervising Provider    Answer:   Deborra Medina L [[id number]]    amLODipine (NORVASC) 5 MG tablet    Sig: Take 1 tablet (5  mg total) by mouth daily.    Dispense:  90 tablet    Refill:  3    Order Specific Question:   Supervising Provider    Answer:   Deborra Medina L [[id number]]   carvedilol (COREG) 6.25 MG tablet    Sig: Take 1 tablet (6.25 mg total) by mouth 2 (two) times daily with a meal.    Dispense:  180 tablet    Refill:  3    Order Specific Question:   Supervising Provider    Answer:   Crecencio Mc [[id number]]   hydrALAZINE (APRESOLINE) 10 MG tablet    Sig: Take 1 tablet (10 mg total) by mouth 2 (two) times daily.    Dispense:  120 tablet    Refill:  1    Order Specific Question:   Supervising Provider    Answer:   Crecencio Mc [[id number]]    Return precautions given.   Risks, benefits, and alternatives of the medications and treatment plan prescribed today were discussed, and patient expressed understanding.   Education regarding symptom management and diagnosis given to patient on AVS.  Continue to follow with Burnard Hawthorne, FNP for routine health maintenance.   Alfredia Ferguson and I agreed with plan.   Mable Paris, FNP

## 2021-04-11 NOTE — Assessment & Plan Note (Signed)
Chronic, stable. continue Coreg 6.25 mg twice a day, amlodipine 5 mg, hydralazine 10 mg twice daily .  Counseled patient again that hydralazine 10 mg continue to be taken 3 times daily however this is difficult for him to remember this third dose.  Pleased to see however blood pressure is well controlled and we can continue this for now with close vigilance; if blood pressure were to elevate again,we  would need to address either being more consistent with hydralazine 10 mg 3 times daily or changing to another agent

## 2021-04-11 NOTE — Patient Instructions (Addendum)
Nice to see you. Please have lab at [facility] in next week.

## 2021-04-11 NOTE — Assessment & Plan Note (Signed)
Chronic, stable continue allopurinol 300 mg

## 2021-08-11 ENCOUNTER — Other Ambulatory Visit: Payer: Self-pay | Admitting: Family

## 2021-08-11 DIAGNOSIS — M109 Gout, unspecified: Secondary | ICD-10-CM

## 2021-08-11 DIAGNOSIS — Z0000 Encounter for general adult medical examination without abnormal findings: Secondary | ICD-10-CM

## 2021-08-28 ENCOUNTER — Other Ambulatory Visit: Payer: Self-pay | Admitting: Family

## 2021-08-28 DIAGNOSIS — I10 Essential (primary) hypertension: Secondary | ICD-10-CM

## 2021-09-08 ENCOUNTER — Other Ambulatory Visit: Payer: Self-pay | Admitting: Family

## 2021-09-08 DIAGNOSIS — J302 Other seasonal allergic rhinitis: Secondary | ICD-10-CM

## 2021-10-27 ENCOUNTER — Encounter: Payer: Self-pay | Admitting: Family

## 2021-10-27 ENCOUNTER — Ambulatory Visit: Payer: Managed Care, Other (non HMO) | Admitting: Family

## 2021-10-27 VITALS — BP 120/72 | HR 74 | Temp 98.0°F | Ht 72.0 in | Wt 207.4 lb

## 2021-10-27 DIAGNOSIS — E785 Hyperlipidemia, unspecified: Secondary | ICD-10-CM

## 2021-10-27 DIAGNOSIS — Z0000 Encounter for general adult medical examination without abnormal findings: Secondary | ICD-10-CM | POA: Diagnosis not present

## 2021-10-27 DIAGNOSIS — I10 Essential (primary) hypertension: Secondary | ICD-10-CM | POA: Diagnosis not present

## 2021-10-27 DIAGNOSIS — M542 Cervicalgia: Secondary | ICD-10-CM

## 2021-10-27 DIAGNOSIS — E559 Vitamin D deficiency, unspecified: Secondary | ICD-10-CM | POA: Diagnosis not present

## 2021-10-27 DIAGNOSIS — M109 Gout, unspecified: Secondary | ICD-10-CM

## 2021-10-27 DIAGNOSIS — Z125 Encounter for screening for malignant neoplasm of prostate: Secondary | ICD-10-CM | POA: Diagnosis not present

## 2021-10-27 DIAGNOSIS — D721 Eosinophilia, unspecified: Secondary | ICD-10-CM | POA: Diagnosis not present

## 2021-10-27 DIAGNOSIS — R768 Other specified abnormal immunological findings in serum: Secondary | ICD-10-CM

## 2021-10-27 LAB — LIPID PANEL
Cholesterol: 189 mg/dL (ref 0–200)
HDL: 55.7 mg/dL (ref >39.00)
LDL Cholesterol: 113 mg/dL — ABNORMAL HIGH (ref 0–99)
NonHDL: 133.51
Total CHOL/HDL Ratio: 3
Triglycerides: 103 mg/dL (ref 0.0–149.0)
VLDL: 20.6 mg/dL (ref 0.0–40.0)

## 2021-10-27 LAB — COMPREHENSIVE METABOLIC PANEL
ALT: 25 U/L (ref 0–53)
AST: 20 U/L (ref 0–37)
Albumin: 4.3 g/dL (ref 3.5–5.2)
Alkaline Phosphatase: 54 U/L (ref 39–117)
BUN: 18 mg/dL (ref 6–23)
CO2: 29 mEq/L (ref 19–32)
Calcium: 8.9 mg/dL (ref 8.4–10.5)
Chloride: 105 mEq/L (ref 96–112)
Creatinine, Ser: 0.86 mg/dL (ref 0.40–1.50)
GFR: 94.92 mL/min (ref >60.00)
Glucose, Bld: 104 mg/dL — ABNORMAL HIGH (ref 70–99)
Potassium: 4.7 mEq/L (ref 3.5–5.1)
Sodium: 139 mEq/L (ref 135–145)
Total Bilirubin: 0.8 mg/dL (ref 0.2–1.2)
Total Protein: 6.8 g/dL (ref 6.0–8.3)

## 2021-10-27 LAB — CBC WITH DIFFERENTIAL/PLATELET
Basophils Absolute: 0.1 10*3/uL (ref 0.0–0.1)
Basophils Relative: 1 % (ref 0.0–3.0)
Eosinophils Absolute: 0.4 10*3/uL (ref 0.0–0.7)
Eosinophils Relative: 7.8 % — ABNORMAL HIGH (ref 0.0–5.0)
HCT: 45.5 % (ref 39.0–52.0)
Hemoglobin: 15 g/dL (ref 13.0–17.0)
Lymphocytes Relative: 24.9 % (ref 12.0–46.0)
Lymphs Abs: 1.3 10*3/uL (ref 0.7–4.0)
MCHC: 33 g/dL (ref 30.0–36.0)
MCV: 92.6 fl (ref 78.0–100.0)
Monocytes Absolute: 0.5 10*3/uL (ref 0.1–1.0)
Monocytes Relative: 9.5 % (ref 3.0–12.0)
Neutro Abs: 3 10*3/uL (ref 1.4–7.7)
Neutrophils Relative %: 56.8 % (ref 43.0–77.0)
Platelets: 216 10*3/uL (ref 150.0–400.0)
RBC: 4.91 Mil/uL (ref 4.22–5.81)
RDW: 13.7 % (ref 11.5–15.5)
WBC: 5.3 10*3/uL (ref 4.0–10.5)

## 2021-10-27 LAB — URIC ACID: Uric Acid, Serum: 4.6 mg/dL (ref 4.0–7.8)

## 2021-10-27 LAB — HEMOGLOBIN A1C: Hgb A1c MFr Bld: 5.8 % (ref 4.6–6.5)

## 2021-10-27 LAB — VITAMIN D 25 HYDROXY (VIT D DEFICIENCY, FRACTURES): VITD: 27.32 ng/mL — ABNORMAL LOW (ref 30.00–100.00)

## 2021-10-27 LAB — PSA: PSA: 3.04 ng/mL (ref 0.10–4.00)

## 2021-10-27 LAB — TSH: TSH: 1 u[IU]/mL (ref 0.35–5.50)

## 2021-10-27 MED ORDER — ALLOPURINOL 100 MG PO TABS: 300.0000 mg | ORAL_TABLET | Freq: Every day | ORAL | 2 refills | Status: DC

## 2021-10-27 NOTE — Patient Instructions (Signed)
Nice to see you! ? ?Let me know when you would like for me to order colonoscopy ?

## 2021-10-27 NOTE — Assessment & Plan Note (Signed)
Chronic, stable.  Continue Coreg 6.25 mg twice a day, amlodipine 5 mg, hydralazine 10 mg BID. ?

## 2021-10-27 NOTE — Assessment & Plan Note (Addendum)
Chronic, symptomatically stable.  Pending uric acid to ensure < 6.  Continue allopurinol 300 mg daily ?

## 2021-10-27 NOTE — Progress Notes (Signed)
? ?Subjective:  ? ? Patient ID: Tony Scott, male    DOB: [birth year], 61 y.o.   MRN: [id number] ? ?CC: Tony Scott is a 61 y.o. male who presents today for follow up.  ? ?HPI: Feels well today. ?No new complaints ?No trouble urinating, urinary hesitancy or decreased urine stream ? ?HTN- compliant with Coreg 6.25 mg twice a day, amlodipine 5 mg, hydralazine 10 mg BID. No cp, sob ? ?Chronic gout-compliant with allopurinol 300 mg QD.  ? ?Due colonoscopy.  He declines at this time and plans to revisit this at follow-up ? ?HISTORY:  ?Past Medical History:  ?Diagnosis Date  ? Allergy   ? Hay fever  ? Anal fissure   ? Arthritis   ? Back pain, chronic   ? abnormality L4-5, per pt  ? Blood in stool   ? Colon polyp   ? Kidney stones   ? ?Past Surgical History:  ?Procedure Laterality Date  ? NOSE SURGERY    ? ?Family History  ?Problem Relation Age of Onset  ? Colon cancer Mother   ? Hypertension Mother   ? Hyperlipidemia Father   ? Heart disease Father   ? Hypertension Father   ? Diabetes Father   ? Rheum arthritis Sister   ? Arthritis Maternal Grandmother   ? Hyperlipidemia Maternal Grandmother   ? Hypertension Maternal Grandmother   ? Heart disease Paternal Grandfather   ? ? ?Allergies: Patient has no known allergies. ?Current Outpatient Medications on File Prior to Visit  ?Medication Sig Dispense Refill  ? amLODipine (NORVASC) 5 MG tablet Take 1 tablet (5 mg total) by mouth daily. 90 tablet 3  ? ascorbic acid (VITAMIN C) 1000 MG tablet Take 1 tablet by mouth daily.    ? carvedilol (COREG) 6.25 MG tablet Take 1 tablet (6.25 mg total) by mouth 2 (two) times daily with a meal. 180 tablet 3  ? Cholecalciferol 25 MCG (1000 UT) tablet Take 1 tablet by mouth daily.    ? hydrALAZINE (APRESOLINE) 10 MG tablet TAKE 1 TABLET TWICE A DAY 120 tablet 5  ? loratadine (CLARITIN) 10 MG tablet TAKE 1 TABLET DAILY 90 tablet 3  ? meloxicam (MOBIC) 15 MG tablet Take 1 tablet by mouth daily.    ? vitamin E 1000 UNIT capsule Take 1,000 Units  by mouth daily.    ? ?No current facility-administered medications on file prior to visit.  ? ? ?Social History  ? ?Tobacco Use  ? Smoking status: Never  ? Smokeless tobacco: Never  ?Vaping Use  ? Vaping Use: Never used  ?Substance Use Topics  ? Alcohol use: Yes  ?  Alcohol/week: 8.0 standard drinks  ?  Types: 8 drink(s) per week  ?  Comment: social  ? Drug use: No  ? ? ?Review of Systems  ?Constitutional:  Negative for chills and fever.  ?Respiratory:  Negative for cough.   ?Cardiovascular:  Negative for chest pain and palpitations.  ?Gastrointestinal:  Negative for nausea and vomiting.  ?Genitourinary:  Negative for decreased urine volume and difficulty urinating.  ?   ?Objective:  ?  ?BP 120/72 (BP Location: Left Arm, Patient Position: Sitting, Cuff Size: Large)   Pulse 74   Temp 98 ?F (36.7 ?C) (Oral)   Ht 6' (1.829 m)   Wt 207 lb 6.4 oz (94.1 kg)   SpO2 97%   BMI 28.13 kg/m?  ?BP Readings from Last 3 Encounters:  ?10/27/21 120/72  ?04/11/21 110/82  ?10/17/20 (!) 143/90  ? ?  Wt Readings from Last 3 Encounters:  ?10/27/21 207 lb 6.4 oz (94.1 kg)  ?04/11/21 208 lb 3.2 oz (94.4 kg)  ?10/17/20 204 lb 1.6 oz (92.6 kg)  ? ? ?Physical Exam ?Vitals reviewed.  ?Constitutional:   ?   Appearance: He is well-developed.  ?Cardiovascular:  ?   Rate and Rhythm: Regular rhythm.  ?   Heart sounds: Normal heart sounds.  ?Pulmonary:  ?   Effort: Pulmonary effort is normal. No respiratory distress.  ?   Breath sounds: Normal breath sounds. No wheezing, rhonchi or rales.  ?Skin: ?   General: Skin is warm and dry.  ?Neurological:  ?   Mental Status: He is alert.  ?Psychiatric:     ?   Speech: Speech normal.     ?   Behavior: Behavior normal.  ? ? ?   ?Assessment & Plan:  ? ?Problem List Items Addressed This Visit   ? ?  ? Cardiovascular and Mediastinum  ? HTN (hypertension)  ?  Chronic, stable.  Continue Coreg 6.25 mg twice a day, amlodipine 5 mg, hydralazine 10 mg BID. ? ?  ?  ?  ? Other  ? Eosinophilia  ? Relevant Orders  ?  CBC with Differential/Platelet  ? Gout  ?  Chronic, symptomatically stable.  Pending uric acid to ensure < 6.  Continue allopurinol 300 mg daily ? ?  ?  ? Relevant Medications  ? allopurinol (ZYLOPRIM) 100 MG tablet  ? Other Relevant Orders  ? Uric acid  ? HLD (hyperlipidemia)  ? Relevant Orders  ? Lipid panel  ? Neck pain  ? Positive ANA (antinuclear antibody) - Primary  ? Routine general medical examination at a health care facility  ? Relevant Medications  ? allopurinol (ZYLOPRIM) 100 MG tablet  ? Other Relevant Orders  ? PSA  ? Hemoglobin A1c  ? TSH  ? Lipid panel  ? CBC with Differential/Platelet  ? Comprehensive metabolic panel  ? Uric acid  ? ?Other Visit Diagnoses   ? ? Essential hypertension      ? Relevant Orders  ? PSA  ? CBC with Differential/Platelet  ? Comprehensive metabolic panel  ? Vitamin D (25 hydroxy)  ? Vitamin D deficiency      ? Relevant Orders  ? Vitamin D (25 hydroxy)  ? ?  ? ? ? ?I have changed Tony Scott's allopurinol. I am also having him maintain his ascorbic acid, Cholecalciferol, meloxicam, vitamin E, amLODipine, carvedilol, hydrALAZINE, and loratadine. ? ? ?Meds ordered this encounter  ?Medications  ? allopurinol (ZYLOPRIM) 100 MG tablet  ?  Sig: Take 3 tablets (300 mg total) by mouth daily.  ?  Dispense:  90 tablet  ?  Refill:  2  ?  Order Specific Question:   Supervising Provider  ?  Answer:   Crecencio Mc [[id number]]  ? ? ?Return precautions given.  ? ?Risks, benefits, and alternatives of the medications and treatment plan prescribed today were discussed, and patient expressed understanding.  ? ?Education regarding symptom management and diagnosis given to patient on AVS. ? ?Continue to follow with Burnard Hawthorne, FNP for routine health maintenance.  ? ?Alfredia Ferguson and I agreed with plan.  ? ?Mable Paris, FNP ? ? ?

## 2022-01-20 ENCOUNTER — Other Ambulatory Visit: Payer: Self-pay

## 2022-01-20 ENCOUNTER — Telehealth: Payer: Self-pay

## 2022-01-20 DIAGNOSIS — R399 Unspecified symptoms and signs involving the genitourinary system: Secondary | ICD-10-CM

## 2022-01-20 NOTE — Telephone Encounter (Signed)
Spoke to patient  and scheduled lab for 01/22/22...and he is scheduled for OV tues 01/27/22

## 2022-01-20 NOTE — Telephone Encounter (Deleted)
-----   Message from Burnard Hawthorne, [place] sent at 01/20/2022  2:49 PM EDT ----- Would you call pt  He sent me a message that he was worried about UTI and in [place] currently.   Can you order urine studies and have leave as soon as he in back in town ?  Please sch him acute visit first available with myself or colleague when he is back. Urine can be collected ahead of appointment so we have prior to appointment.  Please re emphasize being seen by urgent care where he is for his safety . If he develops worsening symptoms, abdominal pain, flank pain, fever , nausea , vomiting , he needs to be seen at [facility] ED asap.

## 2022-01-20 NOTE — Telephone Encounter (Signed)
Patient states he thinks he may have a kidney infection and he is currently out of town.  Please call.

## 2022-01-20 NOTE — Telephone Encounter (Signed)
Called patient to inquire about his message pertaining to him having a kidney infection possibly. Patient stated that he began having lower hip back pain on Friday  through today and on yesterday 7/10 patient stated that he had blood in his urine (significant). I encouraged him to go to UC wherever he is but he stated that he is not going while he is out of town. Patient stated that he will be back in town on thurs or Friday and would like an appt but I did not have any appt times available on Friday. I asked if he would like to see another provider, but he declined. Patient stated that he would reach out to [facility] on her cell.

## 2022-01-20 NOTE — Progress Notes (Signed)
Urine labs ordered 

## 2022-01-20 NOTE — Telephone Encounter (Signed)
Spoke to patient and scheduled labs for him and also a OV for 01/27/22. Stated to patient how important it is to go to [facility] or ED if symptoms get any worse.

## 2022-01-22 ENCOUNTER — Other Ambulatory Visit: Payer: Managed Care, Other (non HMO)

## 2022-01-22 DIAGNOSIS — R399 Unspecified symptoms and signs involving the genitourinary system: Secondary | ICD-10-CM | POA: Diagnosis not present

## 2022-01-22 LAB — MICROALBUMIN / CREATININE URINE RATIO
Creatinine,U: 92.4 mg/dL
Microalb Creat Ratio: 2.4 mg/g (ref 0.0–30.0)
Microalb, Ur: 2.2 mg/dL — ABNORMAL HIGH (ref 0.0–1.9)

## 2022-01-23 ENCOUNTER — Other Ambulatory Visit: Payer: Self-pay | Admitting: *Deleted

## 2022-01-23 DIAGNOSIS — R399 Unspecified symptoms and signs involving the genitourinary system: Secondary | ICD-10-CM

## 2022-01-23 LAB — URINE CULTURE
MICRO NUMBER:: 13643093
Result:: NO GROWTH
SPECIMEN QUALITY:: ADEQUATE

## 2022-01-26 ENCOUNTER — Telehealth: Payer: Self-pay | Admitting: Family

## 2022-01-26 ENCOUNTER — Telehealth: Payer: Self-pay

## 2022-01-26 ENCOUNTER — Other Ambulatory Visit: Payer: Self-pay

## 2022-01-26 DIAGNOSIS — R399 Unspecified symptoms and signs involving the genitourinary system: Secondary | ICD-10-CM

## 2022-01-26 NOTE — Telephone Encounter (Signed)
I faxed the add on sheet Friday morning. I spoke to [facility] lab this morning & they can not find the add on sheet. They stated that it never came across their fax.  Therefore, pt will need to be recollected.

## 2022-01-26 NOTE — Telephone Encounter (Signed)
Tony Scott and Tony Scott,   Still waiting on UA to result  Can we check on status of UA?   Or do we need to recollect?

## 2022-01-26 NOTE — Telephone Encounter (Signed)
LVM to call back because we need another urine sample from him.

## 2022-01-26 NOTE — Telephone Encounter (Signed)
See result note.  

## 2022-01-26 NOTE — Progress Notes (Signed)
Orders only

## 2022-01-27 ENCOUNTER — Ambulatory Visit: Payer: Managed Care, Other (non HMO) | Admitting: Family

## 2022-01-27 ENCOUNTER — Encounter: Payer: Self-pay | Admitting: Family

## 2022-01-27 VITALS — BP 124/84 | HR 69 | Temp 98.2°F | Ht 72.0 in | Wt 207.8 lb

## 2022-01-27 DIAGNOSIS — N200 Calculus of kidney: Secondary | ICD-10-CM

## 2022-01-27 LAB — URINALYSIS, ROUTINE W REFLEX MICROSCOPIC
Bilirubin Urine: NEGATIVE
Hgb urine dipstick: NEGATIVE
Ketones, ur: NEGATIVE
Leukocytes,Ua: NEGATIVE
Nitrite: NEGATIVE
Specific Gravity, Urine: 1.025 (ref 1.000–1.030)
Total Protein, Urine: NEGATIVE
Urine Glucose: NEGATIVE
Urobilinogen, UA: 0.2 (ref 0.0–1.0)
pH: 6 (ref 5.0–8.0)

## 2022-01-27 NOTE — Progress Notes (Signed)
Subjective:    Patient ID: Tony Scott, male    DOB: March 23, 1961, 61 y.o.   MRN: [id number]  CC: Tony Scott is a 61 y.o. male who presents today for an acute visit.    HPI: Here today is passed a renal stone in the shower this morning.  Describes approximately 1 week ago he developed right low back pain, hematuria , since resolved. He continues to have slight  pain with urination however doesn't describe as dysuria.  Denies nausea or fever.  No dysuria.  He has noticed over the years nocturia  and decreased urine flow.  History of renal stone    HISTORY:  Past Medical History:  Diagnosis Date   Allergy    Hay fever   Anal fissure    Arthritis    Back pain, chronic    abnormality L4-5, per pt   Blood in stool    Colon polyp    Kidney stones    Past Surgical History:  Procedure Laterality Date   NOSE SURGERY     Family History  Problem Relation Age of Onset   Colon cancer Mother    Hypertension Mother    Hyperlipidemia Father    Heart disease Father    Hypertension Father    Diabetes Father    Rheum arthritis Sister    Arthritis Maternal Grandmother    Hyperlipidemia Maternal Grandmother    Hypertension Maternal Grandmother    Heart disease Paternal Grandfather     Allergies: Patient has no known allergies. Current Outpatient Medications on File Prior to Visit  Medication Sig Dispense Refill   amLODipine (NORVASC) 5 MG tablet Take 1 tablet (5 mg total) by mouth daily. 90 tablet 3   ascorbic acid (VITAMIN C) 1000 MG tablet Take 1 tablet by mouth daily.     carvedilol (COREG) 6.25 MG tablet Take 1 tablet (6.25 mg total) by mouth 2 (two) times daily with a meal. 180 tablet 3   Cholecalciferol 25 MCG (1000 UT) tablet Take 1 tablet by mouth daily.     hydrALAZINE (APRESOLINE) 10 MG tablet TAKE 1 TABLET TWICE A DAY 120 tablet 5   loratadine (CLARITIN) 10 MG tablet TAKE 1 TABLET DAILY 90 tablet 3   vitamin E 1000 UNIT capsule Take 1,000 Units by mouth daily.      No current facility-administered medications on file prior to visit.    Social History   Tobacco Use   Smoking status: Never   Smokeless tobacco: Never  Vaping Use   Vaping Use: Never used  Substance Use Topics   Alcohol use: Yes    Alcohol/week: 8.0 standard drinks of alcohol    Types: 8 drink(s) per week    Comment: social   Drug use: No    Review of Systems  Constitutional:  Negative for chills and fever.  Respiratory:  Negative for cough.   Cardiovascular:  Negative for chest pain and palpitations.  Gastrointestinal:  Negative for abdominal pain, nausea and vomiting.  Genitourinary:  Positive for difficulty urinating. Negative for flank pain, frequency and hematuria.      Objective:    BP 124/84 (BP Location: Left Arm, Patient Position: Sitting, Cuff Size: Large)   Pulse 69   Temp 98.2 F (36.8 C) (Oral)   Ht 6' (1.829 m)   Wt 207 lb 12.8 oz (94.3 kg)   SpO2 98%   BMI 28.18 kg/m    Physical Exam Vitals reviewed.  Constitutional:  Appearance: He is well-developed.  Cardiovascular:     Rate and Rhythm: Regular rhythm.     Heart sounds: Normal heart sounds.  Pulmonary:     Effort: Pulmonary effort is normal. No respiratory distress.     Breath sounds: Normal breath sounds. No wheezing or rales.  Abdominal:     Comments: No suprapubic tenderness.   Skin:    General: Skin is warm and dry.  Neurological:     Mental Status: He is alert.  Psychiatric:        Speech: Speech normal.        Behavior: Behavior normal.        Assessment & Plan:   Problem List Items Addressed This Visit       Genitourinary   Kidney stones    Patient is passed renal stone.  Fortunately he has not had recurrent, frequent nature of renal stones.  Urine culture did not reveal infection.  We will recheck urine micro at follow-up.  Pending repeat urinalysis, urine culture.  Consulted via staff message with urology, Dr Bernardo Heater, regarding velocity of PSA and symptoms (  nocturia, decreased urine flow)      Other Visit Diagnoses     Renal stone    -  Primary   Relevant Orders   Urinalysis, Routine w reflex microscopic (Completed)   Urine Culture (Completed)         I am having Sunil L. Elie maintain his ascorbic acid, Cholecalciferol, vitamin E, amLODipine, carvedilol, hydrALAZINE, and loratadine.   No orders of the defined types were placed in this encounter.   Return precautions given.   Risks, benefits, and alternatives of the medications and treatment plan prescribed today were discussed, and patient expressed understanding.   Education regarding symptom management and diagnosis given to patient on AVS.  Continue to follow with Burnard Hawthorne, FNP for routine health maintenance.   Alfredia Ferguson and I agreed with plan.   Mable Paris, FNP

## 2022-01-27 NOTE — Assessment & Plan Note (Signed)
Patient is passed renal stone.  Fortunately he has not had recurrent, frequent nature of renal stones.  Urine culture did not reveal infection.  We will recheck urine micro at follow-up.  Pending repeat urinalysis, urine culture.  Consulted via staff message with urology, Dr Bernardo Heater, regarding velocity of PSA and symptoms ( nocturia, decreased urine flow)

## 2022-01-28 LAB — URINE CULTURE
MICRO NUMBER:: 13661602
Result:: NO GROWTH
SPECIMEN QUALITY:: ADEQUATE

## 2022-01-28 NOTE — Telephone Encounter (Signed)
Patient was seen in office on 01/27/22

## 2022-01-29 ENCOUNTER — Encounter: Payer: Self-pay | Admitting: Family

## 2022-01-29 ENCOUNTER — Other Ambulatory Visit: Payer: Managed Care, Other (non HMO)

## 2022-01-30 ENCOUNTER — Other Ambulatory Visit: Payer: Self-pay

## 2022-01-30 MED ORDER — MELOXICAM 15 MG PO TABS: 15.0000 mg | ORAL_TABLET | Freq: Every day | ORAL | 3 refills | Status: DC

## 2022-02-09 ENCOUNTER — Other Ambulatory Visit: Payer: Self-pay | Admitting: Family

## 2022-02-09 DIAGNOSIS — Z0000 Encounter for general adult medical examination without abnormal findings: Secondary | ICD-10-CM

## 2022-02-09 DIAGNOSIS — M109 Gout, unspecified: Secondary | ICD-10-CM

## 2022-02-17 ENCOUNTER — Encounter: Payer: Self-pay | Admitting: Family

## 2022-02-17 ENCOUNTER — Telehealth: Payer: Self-pay | Admitting: Family

## 2022-02-17 DIAGNOSIS — R972 Elevated prostate specific antigen [PSA]: Secondary | ICD-10-CM

## 2022-02-17 NOTE — Telephone Encounter (Signed)
-----   Message from Abbie Sons, MD sent at 02/10/2022 10:12 AM EDT ----- Regarding: RE: PSA Hi Joel Cowin, My apologies for the delay in getting back to you.  I typically base PSA velocity on 3 successive readings.  Although his PSA increased by >0.7 between April 2021 and February 2022 (2.5-3.21) his follow-up PSA in April 2023 was 3.04 so within range.  I would not recommend any further evaluation other than annual monitoring at this time.  If he is concerned about the possibility of prostate cancer I am happy to see him to discuss further.  Please let me know if you have any other questions. Thanks, Event organiser ----- Message ----- From: Burnard Hawthorne, FNP Sent: 01/27/2022  12:29 PM EDT To: Abbie Sons, MD Subject: PSA                                            Dr Bernardo Heater,   [facility] you are well  I wanted your advice on patient I saw this morning.   He had passed a renal stone this morning but he is doing well from that side of things.   However we discussed again the overall trend of PSA - documented 1.41 eight years ago , more recently 3.04.   He has had continued nocturia and decreased urinary stream for some time now. Not particularly bothersome but noticeable to him.    We discussed BPH and trial of flomax which he was reluctant to start at this time.   My greater concern would be if you felt based on PSA velocity over time, if prostate cancer should be considered and he should see you for further evaluation/imaging.   Joycelyn Schmid, [place] Cell [phone]

## 2022-02-17 NOTE — Telephone Encounter (Signed)
Sent patient mychart

## 2022-04-06 ENCOUNTER — Other Ambulatory Visit: Payer: Self-pay | Admitting: Family

## 2022-04-06 DIAGNOSIS — I10 Essential (primary) hypertension: Secondary | ICD-10-CM

## 2022-05-04 ENCOUNTER — Encounter: Payer: Self-pay | Admitting: Family

## 2022-05-04 ENCOUNTER — Ambulatory Visit (INDEPENDENT_AMBULATORY_CARE_PROVIDER_SITE_OTHER): Payer: Managed Care, Other (non HMO) | Admitting: Family

## 2022-05-04 VITALS — BP 122/82 | HR 62 | Temp 98.7°F | Ht 72.0 in | Wt 209.4 lb

## 2022-05-04 DIAGNOSIS — Z1211 Encounter for screening for malignant neoplasm of colon: Secondary | ICD-10-CM

## 2022-05-04 DIAGNOSIS — Z0000 Encounter for general adult medical examination without abnormal findings: Secondary | ICD-10-CM

## 2022-05-04 DIAGNOSIS — C439 Malignant melanoma of skin, unspecified: Secondary | ICD-10-CM | POA: Diagnosis not present

## 2022-05-04 DIAGNOSIS — R351 Nocturia: Secondary | ICD-10-CM

## 2022-05-04 DIAGNOSIS — M2550 Pain in unspecified joint: Secondary | ICD-10-CM

## 2022-05-04 MED ORDER — MELOXICAM 15 MG PO TABS: 15.0000 mg | ORAL_TABLET | Freq: Every day | ORAL | 3 refills | Status: DC | PRN

## 2022-05-04 NOTE — Assessment & Plan Note (Signed)
Reviewed PSA, increased velocity over time.  We jointly agreed referral to urology for further evaluation as appropriate.

## 2022-05-04 NOTE — Progress Notes (Signed)
Subjective:    Patient ID: Tony Scott, male    DOB: 12-Nov-1960, 61 y.o.   MRN: [id number]  CC: Tony Scott is a 61 y.o. male who presents today for physical exam.    HPI: Feels well today.  No new complaints  He continues to endorse nocturia and urinating once or twice at night.  He has also noticed on car rides urinary frequency; he describes that he is unable to hold bladder as long as he used to.  Has noticed this change gradually over the past few years.  No urinary hesitancy, decreased urine stream.  He is compliant meloxicam 50 mg daily for knee pain.  He may take Aleve prior to golf. H/o right scapular melana - following  with [facility]  Colorectal  Cancer Screening: due, last 04/01/22 Prostate Cancer Screening:Elevated PSA  Lung Cancer Screening: No 30 year pack year history and > 50 years to 9 years.  AAA screen for all men and women aged 84 to 46 with h/o tobacco, men 33 years older with family h/o AAA and women 34 years or older who have ever smoked or have family history of AAA.  Immunizations       Tetanus - due,declines        Labs: Screening labs 6 months prior; declines labs today Exercise: Gets regular exercise with golf.  Denies chest pain. Alcohol use:  occassionally Smoking/tobacco use: Nonsmoker.     HISTORY:  Past Medical History:  Diagnosis Date   Allergy    Hay fever   Anal fissure    Arthritis    Back pain, chronic    abnormality L4-5, per pt   Blood in stool    Colon polyp    Kidney stones     Past Surgical History:  Procedure Laterality Date   NOSE SURGERY     Family History  Problem Relation Age of Onset   Colon cancer Mother    Hypertension Mother    Hyperlipidemia Father    Heart disease Father    Hypertension Father    Diabetes Father    Rheum arthritis Sister    Arthritis Maternal Grandmother    Hyperlipidemia Maternal Grandmother    Hypertension Maternal Grandmother    Heart disease Paternal Grandfather        ALLERGIES: Patient has no known allergies.  Current Outpatient Medications on File Prior to Visit  Medication Sig Dispense Refill   allopurinol (ZYLOPRIM) 100 MG tablet TAKE 1 TABLET THREE TIMES A DAY 270 tablet 3   amLODipine (NORVASC) 5 MG tablet Take 1 tablet (5 mg total) by mouth daily. 90 tablet 3   ascorbic acid (VITAMIN C) 1000 MG tablet Take 1 tablet by mouth daily.     carvedilol (COREG) 6.25 MG tablet TAKE 1 TABLET TWICE A DAY WITH MEALS 180 tablet 3   Cholecalciferol 25 MCG (1000 UT) tablet Take 1 tablet by mouth daily.     hydrALAZINE (APRESOLINE) 10 MG tablet TAKE 1 TABLET TWICE A DAY 120 tablet 5   loratadine (CLARITIN) 10 MG tablet TAKE 1 TABLET DAILY 90 tablet 3   vitamin E 1000 UNIT capsule Take 1,000 Units by mouth daily.     No current facility-administered medications on file prior to visit.    Social History   Tobacco Use   Smoking status: Never   Smokeless tobacco: Never  Vaping Use   Vaping Use: Never used  Substance Use Topics   Alcohol use: Yes  Alcohol/week: 8.0 standard drinks of alcohol    Types: 8 drink(s) per week    Comment: social   Drug use: No    Review of Systems  Constitutional:  Negative for chills and fever.  HENT:  Negative for congestion.   Respiratory:  Negative for cough.   Cardiovascular:  Negative for chest pain, palpitations and leg swelling.  Gastrointestinal:  Negative for diarrhea, nausea and vomiting.  Musculoskeletal:  Negative for myalgias.  Skin:  Negative for rash.  Neurological:  Negative for headaches.  Hematological:  Negative for adenopathy.  Psychiatric/Behavioral:  Negative for confusion.       Objective:    BP 122/82 (BP Location: Left Arm, Patient Position: Sitting, Cuff Size: Normal)   Pulse 62   Temp 98.7 F (37.1 C) (Oral)   Ht 6' (1.829 m)   Wt 209 lb 6.4 oz (95 kg)   SpO2 98%   BMI 28.40 kg/m   BP Readings from Last 3 Encounters:  05/04/22 122/82  01/27/22 124/84  10/27/21 120/72    Wt Readings from Last 3 Encounters:  05/04/22 209 lb 6.4 oz (95 kg)  01/27/22 207 lb 12.8 oz (94.3 kg)  10/27/21 207 lb 6.4 oz (94.1 kg)    Physical Exam Vitals reviewed.  Constitutional:      Appearance: He is well-developed.  Cardiovascular:     Rate and Rhythm: Regular rhythm.     Heart sounds: Normal heart sounds.  Pulmonary:     Effort: Pulmonary effort is normal. No respiratory distress.     Breath sounds: Normal breath sounds. No wheezing, rhonchi or rales.  Skin:    General: Skin is warm and dry.  Neurological:     Mental Status: He is alert.  Psychiatric:        Speech: Speech normal.        Behavior: Behavior normal.        Assessment & Plan:   Problem List Items Addressed This Visit       Musculoskeletal and Integument   Melanoma of skin ([place])     Other   Arthralgia    Chronic knee pain.  Advised for him to trial taking meloxicam more as needed versus daily.  Counseled him on NSAIDs use in regard to risk of gastritis, gastric ulcer, change in renal indices.  Advised him to trial scheduling Tylenol arthritis and using meloxicam less frequently.  He will let me know how he is doing      Relevant Medications   meloxicam (MOBIC) 15 MG tablet   Nocturia    Reviewed PSA, increased velocity over time.  We jointly agreed referral to urology for further evaluation as appropriate.      Relevant Orders   Ambulatory referral to Urology   Routine general medical examination at a health care facility - Primary    Referral placed for colonoscopy.  Screening labs done previously and patient politely declines repeat labs at this time.  We will repeat labs in the new year.      Relevant Orders   Ambulatory referral to Gastroenterology   Other Visit Diagnoses     Screen for colon cancer       Relevant Orders   Ambulatory referral to Gastroenterology        I have changed Cecilie Lowers L. Friberg's meloxicam. I am also having him maintain his ascorbic acid,  Cholecalciferol, vitamin E, amLODipine, hydrALAZINE, loratadine, allopurinol, and carvedilol.   Meds ordered this encounter  Medications   meloxicam (MOBIC)  15 MG tablet    Sig: Take 1 tablet (15 mg total) by mouth daily as needed for pain.    Dispense:  90 tablet    Refill:  3    Order Specific Question:   Supervising Provider    Answer:   Crecencio Mc [[id number]]    Return precautions given.   Risks, benefits, and alternatives of the medications and treatment plan prescribed today were discussed, and patient expressed understanding.   Education regarding symptom management and diagnosis given to patient on AVS.   Continue to follow with Burnard Hawthorne, FNP for routine health maintenance.   Alfredia Ferguson and I agreed with plan.   Mable Paris, FNP

## 2022-05-04 NOTE — Assessment & Plan Note (Signed)
Chronic knee pain.  Advised for him to trial taking meloxicam more as needed versus daily.  Counseled him on NSAIDs use in regard to risk of gastritis, gastric ulcer, change in renal indices.  Advised him to trial scheduling Tylenol arthritis and using meloxicam less frequently.  He will let me know how he is doing

## 2022-05-04 NOTE — Assessment & Plan Note (Signed)
Referral placed for colonoscopy.  Screening labs done previously and patient politely declines repeat labs at this time.  We will repeat labs in the new year.

## 2022-05-04 NOTE — Patient Instructions (Addendum)
As discussed, let's start by scheduling Tylenol Arthritis which is a '650mg'$  tablet .   You may take 1-2 tablets every 8 hours ( scheduled) with maximum of 6 tablets per day.   I would like you to try taking mobic as needed.    For example , you could take 1 tablet in the morning ( 8am) and then 1 then one tablet again at 4pm. You may increase from this starting point to maximum of 2 tablet in the morning and 2 tablets at night.   Maximum daily dose of acetaminophen 4 g per day from all sources.  If you are taking another medication which includes acetaminophen (Tylenol) which may be in cough and cold preparations or pain medication such as Percocet, you will need to factor that into your total daily dose to be safe.  Please let me know if any questions   Referral to urology and gastroenterology  Let us know if you dont hear back within a week in regards to an appointment being scheduled.   So that you are aware, if you are [facility] MyChart user , please pay attention to your MyChart messages as you may receive a MyChart message with a phone number to call and schedule this test/appointment own your own from our referral coordinator. This is a new process so I do not want you to miss this message.  If you are not a MyChart user, you will receive a phone call.

## 2022-05-14 ENCOUNTER — Ambulatory Visit: Payer: Managed Care, Other (non HMO) | Admitting: Urology

## 2022-05-14 ENCOUNTER — Encounter: Payer: Self-pay | Admitting: Urology

## 2022-05-14 VITALS — BP 149/86 | HR 69 | Ht 74.0 in | Wt 205.0 lb

## 2022-05-14 DIAGNOSIS — N401 Enlarged prostate with lower urinary tract symptoms: Secondary | ICD-10-CM

## 2022-05-14 LAB — URINALYSIS, COMPLETE
Bilirubin, UA: NEGATIVE
Glucose, UA: NEGATIVE
Ketones, UA: NEGATIVE
Leukocytes,UA: NEGATIVE
Nitrite, UA: NEGATIVE
Protein,UA: NEGATIVE
RBC, UA: NEGATIVE
Specific Gravity, UA: 1.025 (ref 1.005–1.030)
Urobilinogen, Ur: 0.2 mg/dL (ref 0.2–1.0)
pH, UA: 5 (ref 5.0–7.5)

## 2022-05-14 LAB — MICROSCOPIC EXAMINATION: Bacteria, UA: NONE SEEN

## 2022-05-14 MED ORDER — TAMSULOSIN HCL 0.4 MG PO CAPS: 0.4000 mg | ORAL_CAPSULE | Freq: Every day | ORAL | 0 refills | Status: DC

## 2022-05-14 NOTE — Progress Notes (Signed)
05/14/2022 1:44 PM   Tony Scott [birth year] [id number]  Referring provider: Burnard Hawthorne, FNP [address] [place],  [place] [address]  Chief Complaint  Patient presents with   Other    nocturia    HPI: Tony Scott is a 61 y.o. male referred for evaluation of nocturia/lower urinary tract symptoms and to discuss PSA velocity.  Over the past several months some worsening lower urinary tract symptoms with urinary frequency, urgency and nocturia x1-2 IPSS 11-12/35 Denies dysuria, gross hematuria No flank, abdominal or pelvic pain PSA trend over the past several years:     PMH: Past Medical History:  Diagnosis Date   Allergy    Hay fever   Anal fissure    Arthritis    Back pain, chronic    abnormality L4-5, per pt   Blood in stool    Colon polyp    Kidney stones     Surgical History: Past Surgical History:  Procedure Laterality Date   NOSE SURGERY      Home Medications:  Allergies as of 05/14/2022   No Known Allergies      Medication List        Accurate as of May 14, 2022  1:44 PM. If you have any questions, ask your nurse or doctor.          allopurinol 100 MG tablet Commonly known as: ZYLOPRIM TAKE 1 TABLET THREE TIMES A DAY   amLODipine 5 MG tablet Commonly known as: NORVASC Take 1 tablet (5 mg total) by mouth daily.   ascorbic acid 1000 MG tablet Commonly known as: VITAMIN C Take 1 tablet by mouth daily.   carvedilol 6.25 MG tablet Commonly known as: COREG TAKE 1 TABLET TWICE A DAY WITH MEALS   Cholecalciferol 25 MCG (1000 UT) tablet Take 1 tablet by mouth daily.   hydrALAZINE 10 MG tablet Commonly known as: APRESOLINE TAKE 1 TABLET TWICE A DAY   loratadine 10 MG tablet Commonly known as: CLARITIN TAKE 1 TABLET DAILY   meloxicam 15 MG tablet Commonly known as: MOBIC Take 1 tablet (15 mg total) by mouth daily as needed for pain.   vitamin E 1000 UNIT capsule Take 1,000 Units by mouth daily.         Allergies: No Known Allergies  Family History: Family History  Problem Relation Age of Onset   Colon cancer Mother    Hypertension Mother    Hyperlipidemia Father    Heart disease Father    Hypertension Father    Diabetes Father    Rheum arthritis Sister    Arthritis Maternal Grandmother    Hyperlipidemia Maternal Grandmother    Hypertension Maternal Grandmother    Heart disease Paternal Grandfather     Social History:  reports that he has never smoked. He has never used smokeless tobacco. He reports current alcohol use of about 8.0 standard drinks of alcohol per week. He reports that he does not use drugs.   Physical Exam: BP (!) 149/86   Pulse 69   Ht '6\' 2"'$  (1.88 m)   Wt 205 lb (93 kg)   BMI 26.32 kg/m   Constitutional:  Alert and oriented, No acute distress. HEENT: [place] AT Respiratory: Normal respiratory effort, no increased work of breathing. GU: Prostate 50 g, smooth without nodules   Assessment & Plan:    1.  BPH with LUTS Moderate lower urinary tract symptoms We discussed the decision to treat as probably based on degree of  bother He is open to a trial of medical management and Rx tamsulosin was sent to pharmacy to try for 30 days.  If he does note significant improvement in his lower urinary tract symptoms he will call back for refills Schedule annual follow-up  2.  Prostate cancer screening Benign DRE Normal PSA Slight PSA increase over the years is expected and not indicative of an abnormal PSA velocity   Abbie Sons, MD  [facility] [address], [place], [place] [address] [phone]

## 2022-05-15 ENCOUNTER — Encounter: Payer: Self-pay | Admitting: Family

## 2022-05-15 DIAGNOSIS — N400 Enlarged prostate without lower urinary tract symptoms: Secondary | ICD-10-CM | POA: Insufficient documentation

## 2022-05-20 ENCOUNTER — Other Ambulatory Visit: Payer: Self-pay | Admitting: Family

## 2022-05-20 DIAGNOSIS — M2550 Pain in unspecified joint: Secondary | ICD-10-CM

## 2022-05-27 ENCOUNTER — Other Ambulatory Visit: Payer: Self-pay | Admitting: Family

## 2022-05-27 DIAGNOSIS — I10 Essential (primary) hypertension: Secondary | ICD-10-CM

## 2022-06-10 ENCOUNTER — Other Ambulatory Visit: Payer: Self-pay | Admitting: Urology

## 2022-07-07 ENCOUNTER — Other Ambulatory Visit: Payer: Self-pay | Admitting: *Deleted

## 2022-07-07 MED ORDER — TAMSULOSIN HCL 0.4 MG PO CAPS: 0.4000 mg | ORAL_CAPSULE | Freq: Every day | ORAL | 3 refills | Status: DC

## 2022-08-25 ENCOUNTER — Other Ambulatory Visit: Payer: Self-pay | Admitting: Family Medicine

## 2022-08-25 DIAGNOSIS — I10 Essential (primary) hypertension: Secondary | ICD-10-CM

## 2022-09-03 ENCOUNTER — Other Ambulatory Visit: Payer: Self-pay | Admitting: Family

## 2022-09-03 DIAGNOSIS — J302 Other seasonal allergic rhinitis: Secondary | ICD-10-CM

## 2022-09-14 ENCOUNTER — Ambulatory Visit: Payer: Managed Care, Other (non HMO)

## 2022-09-14 ENCOUNTER — Ambulatory Visit: Payer: Managed Care, Other (non HMO) | Admitting: Family

## 2022-09-14 DIAGNOSIS — K640 First degree hemorrhoids: Secondary | ICD-10-CM | POA: Diagnosis not present

## 2022-09-14 DIAGNOSIS — Z1211 Encounter for screening for malignant neoplasm of colon: Secondary | ICD-10-CM | POA: Diagnosis present

## 2022-09-14 DIAGNOSIS — K5730 Diverticulosis of large intestine without perforation or abscess without bleeding: Secondary | ICD-10-CM | POA: Diagnosis not present

## 2022-09-21 ENCOUNTER — Encounter: Payer: Self-pay | Admitting: Family

## 2022-09-21 ENCOUNTER — Ambulatory Visit: Payer: Managed Care, Other (non HMO) | Admitting: Family

## 2022-09-21 VITALS — BP 130/78 | HR 70 | Temp 98.4°F | Ht 72.0 in | Wt 206.8 lb

## 2022-09-21 DIAGNOSIS — Z8249 Family history of ischemic heart disease and other diseases of the circulatory system: Secondary | ICD-10-CM

## 2022-09-21 DIAGNOSIS — I10 Essential (primary) hypertension: Secondary | ICD-10-CM | POA: Diagnosis not present

## 2022-09-21 DIAGNOSIS — R200 Anesthesia of skin: Secondary | ICD-10-CM

## 2022-09-21 DIAGNOSIS — M109 Gout, unspecified: Secondary | ICD-10-CM | POA: Diagnosis not present

## 2022-09-21 DIAGNOSIS — Z125 Encounter for screening for malignant neoplasm of prostate: Secondary | ICD-10-CM

## 2022-09-21 DIAGNOSIS — D721 Eosinophilia, unspecified: Secondary | ICD-10-CM

## 2022-09-21 DIAGNOSIS — Z8639 Personal history of other endocrine, nutritional and metabolic disease: Secondary | ICD-10-CM | POA: Diagnosis not present

## 2022-09-21 DIAGNOSIS — Z13220 Encounter for screening for lipoid disorders: Secondary | ICD-10-CM | POA: Diagnosis not present

## 2022-09-21 DIAGNOSIS — Z136 Encounter for screening for cardiovascular disorders: Secondary | ICD-10-CM

## 2022-09-21 LAB — CBC WITH DIFFERENTIAL/PLATELET
Basophils Absolute: 0.1 10*3/uL (ref 0.0–0.1)
Basophils Relative: 1.2 % (ref 0.0–3.0)
Eosinophils Absolute: 0.4 10*3/uL (ref 0.0–0.7)
Eosinophils Relative: 7.9 % — ABNORMAL HIGH (ref 0.0–5.0)
HCT: 46.3 % (ref 39.0–52.0)
Hemoglobin: 15.3 g/dL (ref 13.0–17.0)
Lymphocytes Relative: 29.1 % (ref 12.0–46.0)
Lymphs Abs: 1.5 10*3/uL (ref 0.7–4.0)
MCHC: 33 g/dL (ref 30.0–36.0)
MCV: 92.9 fl (ref 78.0–100.0)
Monocytes Absolute: 0.4 10*3/uL (ref 0.1–1.0)
Monocytes Relative: 8.1 % (ref 3.0–12.0)
Neutro Abs: 2.7 10*3/uL (ref 1.4–7.7)
Neutrophils Relative %: 53.7 % (ref 43.0–77.0)
Platelets: 203 10*3/uL (ref 150.0–400.0)
RBC: 4.98 Mil/uL (ref 4.22–5.81)
RDW: 13.4 % (ref 11.5–15.5)
WBC: 5.1 10*3/uL (ref 4.0–10.5)

## 2022-09-21 LAB — LIPID PANEL
Cholesterol: 181 mg/dL (ref 0–200)
HDL: 54.2 mg/dL (ref >39.00)
LDL Cholesterol: 112 mg/dL — ABNORMAL HIGH (ref 0–99)
NonHDL: 127.19
Total CHOL/HDL Ratio: 3
Triglycerides: 75 mg/dL (ref 0.0–149.0)
VLDL: 15 mg/dL (ref 0.0–40.0)

## 2022-09-21 LAB — URIC ACID: Uric Acid, Serum: 4.2 mg/dL (ref 4.0–7.8)

## 2022-09-21 LAB — PSA, MEDICARE: PSA: 3.44 ng/ml (ref 0.10–4.00)

## 2022-09-21 LAB — TSH: TSH: 0.82 u[IU]/mL (ref 0.35–5.50)

## 2022-09-21 LAB — VITAMIN D 25 HYDROXY (VIT D DEFICIENCY, FRACTURES): VITD: 22.46 ng/mL — ABNORMAL LOW (ref 30.00–100.00)

## 2022-09-21 MED ORDER — GABAPENTIN 100 MG PO CAPS: 100.0000 mg | ORAL_CAPSULE | Freq: Every day | ORAL | 1 refills | Status: DC

## 2022-09-21 NOTE — Patient Instructions (Signed)
We discussed pursuing a  CT calcium score ( SELF PAY option)  to further stratify your overall cardiovascular risk .   An estimate of cost is $150-200 out-of-pocket as not covered by insurance. However please call your insurance company in advance to ensure no other costs so that you do not have any unexpected bills.     In [facility], It can be performed at any of these 3 locations (outpatient imaging center Energy manager) at [address]/ [facility], [facility] or [place] outpatient center)   I generally place the order to [employer] in [place] as  generally most convenient.   Phone Number to [facility] on [address] is is [phone] to get scheduled.   Below an article from [facility] regarding the test.   https://[email or web address].org/imaging/exams-and-procedures/screenings/cardiac-ct#:~:text=A%20cardiac%20CT%20calcium%20score,arteries%20can%20cause%20heart%20attacks.   Exams We Offer: Cardiac CT Calcium Score  Knowing your score could save your life. A cardiac CT calcium score, also known as a coronary calcium scan, is a quick, convenient and noninvasive way of evaluating the amount of calcified (hard) plaque in your heart vessels. The level of calcium equates to the extent of plaque build-up in your arteries. Plaque in the arteries can cause heart attacks.  The radiologist reads the images and sends your doctor a report with a calcium score. Patients with higher scores have a greater risk for a heart attack, heart disease or stroke. Knowing your score can help your doctor decide on blood pressure and cholesterol goals that will minimize your risk as much as possible.  The [employer] of Cardiology found that Coronary artery calcification (CAC) is an excellent cardiovascular disease risk marker and can help guide the decision to use cholesterol reducing medications such as statins. A negative calcium score may reduce the need for statins in otherwise  eligible patients.  The exam takes less than 10 minutes, is painless and does not require any IV or oral contrast. At [facility] locations, the out-of-pocket fee without insurance is $75. At the time of scheduling, please let us know if you want to process the exam through your insurance or self-pay at the rate of $75. Patients who want to self-pay should not submit their insurance card when checking in for the appointment.   Who should get a Cardiac CT Calcium Score: Middle age adults at intermediate risk of heart disease Family history of heart disease Borderline high cholesterol, high blood pressure or diabetes Overweight or physical inactivity Uncertain about taking daily preventive medical therapy

## 2022-09-21 NOTE — Progress Notes (Signed)
Assessment & Plan:  Right arm numbness Assessment & Plan: Differential includes rotator cuff tendinopathy , cervical nerve root compression, brachial plexus injury, cervical spine degenerative changes. Able to reproduce this on exam today. Previous known h/o DDD cervical spine.  Politely declines at repeat x-ray cervical spine to monitor for progression or right shoulder x-ray.  Suspect leaning on chair resting car is aggravating.  Trial gabapentin 100 mg nightly and titrate. Consider orthopedic consult, emg testing. Close follow up.    Orders: -     EKG 12-Lead -     Gabapentin; Take 1 capsule (100 mg total) by mouth at bedtime.  Dispense: 30 capsule; Refill: 1  Encounter for lipid screening for cardiovascular disease -     Lipid panel  History of vitamin D deficiency -     PSA, Medicare -     VITAMIN D 25 Hydroxy (Vit-D Deficiency, Fractures)  Screening for prostate cancer  Primary hypertension Assessment & Plan: Chronic, stable.  Continue Coreg 6.25 mg twice a day, amlodipine 5 mg, hydralazine 10 mg BID ( BID dosing is preferred by patient).  Orders: -     CBC with Differential/Platelet -     TSH  Eosinophilia, unspecified type -     CBC with Differential/Platelet  Gout, unspecified cause, unspecified chronicity, unspecified site -     Uric acid  Family history of early CAD Assessment & Plan: Obtained EKG in regards to numbness radiating to ensure not a cardiac etiology.  EKG shows sinus rhythm without acute changes when compared to previous EKG 03/08/2015.  We discuss family history of CAD.  I have strongly advised consideration for CT calcium score to further stratify patient's risk.  He will consider this.      Return precautions given.   Risks, benefits, and alternatives of the medications and treatment plan prescribed today were discussed, and patient expressed understanding.   Education regarding symptom management and diagnosis given to patient on AVS either  electronically or printed.  Return in about 6 weeks (around 11/02/2022).  Tony Paris, FNP  Subjective:    Patient ID: Tony Scott, male    DOB: [birth year], 62 y.o.   MRN: [id number]  CC: Tony Scott is a 62 y.o. male who presents today for follow up.   HPI: Complains of right under arm numbness which he noticed while driving one week ago to [place]. He rests arm on elbow rest in car. He drives a fair amount for work.  Symptom comes and goes.  Numbness can radiate to right scapula.   No associated CP,  cough, sob, arm swelling, pain with eating, left arm numbness, jaw numbness, radiating neck pain, HA, vision changes.   H/o neck pain although not bothersome at this time.   Playing  golf yesterday without symptoms while swinging golf club.   Taking mobic daily for bilateral knee pain.     Father had CAD in 101's.  Never smoker  Allergies: Patient has no known allergies. Current Outpatient Medications on File Prior to Visit  Medication Sig Dispense Refill   allopurinol (ZYLOPRIM) 100 MG tablet TAKE 1 TABLET THREE TIMES A DAY 270 tablet 3   amLODipine (NORVASC) 5 MG tablet TAKE 1 TABLET DAILY 90 tablet 3   ascorbic acid (VITAMIN C) 1000 MG tablet Take 1 tablet by mouth daily.     carvedilol (COREG) 6.25 MG tablet TAKE 1 TABLET TWICE A DAY WITH MEALS 180 tablet 3   Cholecalciferol 25 MCG (1000 UT)  tablet Take 1 tablet by mouth daily.     hydrALAZINE (APRESOLINE) 10 MG tablet TAKE 1 TABLET TWICE A DAY 120 tablet 5   loratadine (CLARITIN) 10 MG tablet TAKE 1 TABLET DAILY 90 tablet 3   meloxicam (MOBIC) 15 MG tablet TAKE 1 TABLET (15 MG TOTAL) BY MOUTH DAILY. 30 tablet 3   tamsulosin (FLOMAX) 0.4 MG CAPS capsule Take 1 capsule (0.4 mg total) by mouth daily. 90 capsule 3   vitamin E 1000 UNIT capsule Take 1,000 Units by mouth daily.     No current facility-administered medications on file prior to visit.    Review of Systems  Constitutional:  Negative for chills and  fever.  Respiratory:  Negative for cough and shortness of breath.   Cardiovascular:  Negative for chest pain and palpitations.  Gastrointestinal:  Negative for nausea and vomiting.  Musculoskeletal:  Positive for arthralgias. Negative for joint swelling and neck pain.  Neurological:  Positive for numbness.      Objective:    BP 130/78   Pulse 70   Temp 98.4 F (36.9 C) (Oral)   Ht 6' (1.829 m)   Wt 206 lb 12.8 oz (93.8 kg)   SpO2 98%   BMI 28.05 kg/m  BP Readings from Last 3 Encounters:  09/21/22 130/78  05/14/22 (!) 149/86  05/04/22 122/82   Wt Readings from Last 3 Encounters:  09/21/22 206 lb 12.8 oz (93.8 kg)  05/14/22 205 lb (93 kg)  05/04/22 209 lb 6.4 oz (95 kg)    Physical Exam Vitals reviewed.  Constitutional:      Appearance: He is well-developed.  Cardiovascular:     Rate and Rhythm: Regular rhythm.     Heart sounds: Normal heart sounds.  Pulmonary:     Effort: Pulmonary effort is normal. No respiratory distress.     Breath sounds: Normal breath sounds. No wheezing, rhonchi or rales.  Chest:     Chest wall: No tenderness.  Musculoskeletal:     Right shoulder: No tenderness or bony tenderness. Normal range of motion. Normal strength.     Left shoulder: No tenderness or bony tenderness. Normal range of motion. Normal strength.       Arms:     Cervical back: No pain with movement, spinous process tenderness or muscular tenderness.     Comments: Right Shoulder:   No asymmetry of shoulders when comparing right and left Pain with palpation over posterior glenohumeral joint lines. No pain over Kappa joint, AC joint.  No pain with internal and external rotation. No pain with resisted lateral extension .   Negative active painful arc sign. Negative drop arm.  No pain, swelling, or ecchymosis noted over long head of biceps.   Strength and sensation normal BUE's.   Skin:    General: Skin is warm and dry.  Neurological:     Mental Status: He is alert.   Psychiatric:        Speech: Speech normal.        Behavior: Behavior normal.

## 2022-09-21 NOTE — Assessment & Plan Note (Addendum)
Chronic, stable.  Continue Coreg 6.25 mg twice a day, amlodipine 5 mg, hydralazine 10 mg BID ( BID dosing is preferred by patient).

## 2022-09-21 NOTE — Assessment & Plan Note (Addendum)
Obtained EKG in regards to numbness radiating to ensure not a cardiac etiology.  EKG shows sinus rhythm without acute changes when compared to previous EKG 03/08/2015.  We discuss family history of CAD.  I have strongly advised consideration for CT calcium score to further stratify patient's risk.  He will consider this.

## 2022-09-21 NOTE — Assessment & Plan Note (Addendum)
Differential includes rotator cuff tendinopathy , cervical nerve root compression, brachial plexus injury, cervical spine degenerative changes. Able to reproduce this on exam today. Previous known h/o DDD cervical spine.  Politely declines at repeat x-ray cervical spine to monitor for progression or right shoulder x-ray.  Suspect leaning on chair resting car is aggravating.  Trial gabapentin 100 mg nightly and titrate. Consider orthopedic consult, emg testing. Close follow up.

## 2022-09-22 ENCOUNTER — Telehealth: Payer: Self-pay

## 2022-09-22 NOTE — Telephone Encounter (Signed)
Pt called back and I read the message to him and he stated he will come and pick up the form

## 2022-09-22 NOTE — Telephone Encounter (Signed)
Noted. Thanks.

## 2022-09-22 NOTE — Telephone Encounter (Signed)
LVM to call back to see if pt would like to come pick up [employer] form or would he like [place] to fax it?

## 2022-11-06 ENCOUNTER — Encounter: Payer: Self-pay | Admitting: Family

## 2022-11-09 ENCOUNTER — Other Ambulatory Visit: Payer: Self-pay

## 2022-11-09 ENCOUNTER — Other Ambulatory Visit (INDEPENDENT_AMBULATORY_CARE_PROVIDER_SITE_OTHER): Payer: Managed Care, Other (non HMO)

## 2022-11-09 DIAGNOSIS — I10 Essential (primary) hypertension: Secondary | ICD-10-CM

## 2022-11-09 NOTE — Telephone Encounter (Signed)
Orders in appt scheduled

## 2022-11-10 LAB — COMPREHENSIVE METABOLIC PANEL
ALT: 19 U/L (ref 0–53)
AST: 18 U/L (ref 0–37)
Albumin: 4.2 g/dL (ref 3.5–5.2)
Alkaline Phosphatase: 40 U/L (ref 39–117)
BUN: 26 mg/dL — ABNORMAL HIGH (ref 6–23)
CO2: 32 mEq/L (ref 19–32)
Calcium: 9.8 mg/dL (ref 8.4–10.5)
Chloride: 104 mEq/L (ref 96–112)
Creatinine, Ser: 1.25 mg/dL (ref 0.40–1.50)
GFR: 62.67 mL/min (ref >60.00)
Glucose, Bld: 82 mg/dL (ref 70–99)
Potassium: 5.6 mEq/L — ABNORMAL HIGH (ref 3.5–5.1)
Sodium: 141 mEq/L (ref 135–145)
Total Bilirubin: 0.7 mg/dL (ref 0.2–1.2)
Total Protein: 6.5 g/dL (ref 6.0–8.3)

## 2022-11-11 ENCOUNTER — Other Ambulatory Visit: Payer: Self-pay | Admitting: Family

## 2022-11-11 ENCOUNTER — Telehealth: Payer: Self-pay

## 2022-11-11 DIAGNOSIS — E875 Hyperkalemia: Secondary | ICD-10-CM

## 2022-11-11 NOTE — Telephone Encounter (Signed)
error 

## 2022-11-20 ENCOUNTER — Telehealth: Payer: Self-pay

## 2022-11-20 NOTE — Telephone Encounter (Signed)
LVM to call back to remind him to go to [facility] to get his potassium rechecked

## 2022-11-25 ENCOUNTER — Telehealth: Payer: Self-pay

## 2022-11-25 NOTE — Telephone Encounter (Signed)
LVM to see if pt had his potassium rechecked

## 2022-12-01 ENCOUNTER — Telehealth: Payer: Self-pay

## 2022-12-01 NOTE — Telephone Encounter (Signed)
LVM to see if pt had his potassium rechecked 

## 2023-04-15 ENCOUNTER — Other Ambulatory Visit: Payer: Self-pay | Admitting: Family

## 2023-04-15 DIAGNOSIS — M2550 Pain in unspecified joint: Secondary | ICD-10-CM

## 2023-04-15 DIAGNOSIS — Z0000 Encounter for general adult medical examination without abnormal findings: Secondary | ICD-10-CM

## 2023-04-15 DIAGNOSIS — M109 Gout, unspecified: Secondary | ICD-10-CM

## 2023-04-15 DIAGNOSIS — I10 Essential (primary) hypertension: Secondary | ICD-10-CM

## 2023-05-07 ENCOUNTER — Encounter: Payer: Self-pay | Admitting: Urology

## 2023-05-13 ENCOUNTER — Ambulatory Visit: Payer: Managed Care, Other (non HMO) | Admitting: Urology

## 2023-05-26 ENCOUNTER — Ambulatory Visit: Payer: Managed Care, Other (non HMO) | Admitting: Urology

## 2023-05-26 ENCOUNTER — Encounter: Payer: Self-pay | Admitting: Urology

## 2023-05-26 VITALS — BP 132/81 | HR 70 | Ht 72.0 in | Wt 202.0 lb

## 2023-05-26 DIAGNOSIS — Z125 Encounter for screening for malignant neoplasm of prostate: Secondary | ICD-10-CM | POA: Diagnosis not present

## 2023-05-26 DIAGNOSIS — N401 Enlarged prostate with lower urinary tract symptoms: Secondary | ICD-10-CM | POA: Diagnosis not present

## 2023-05-26 LAB — URINALYSIS, COMPLETE
Bilirubin, UA: NEGATIVE
Glucose, UA: NEGATIVE
Ketones, UA: NEGATIVE
Leukocytes,UA: NEGATIVE
Nitrite, UA: NEGATIVE
Protein,UA: NEGATIVE
RBC, UA: NEGATIVE
Specific Gravity, UA: >1.03 — ABNORMAL HIGH (ref 1.005–1.030)
Urobilinogen, Ur: 0.2 mg/dL (ref 0.2–1.0)
pH, UA: 6 (ref 5.0–7.5)

## 2023-05-26 LAB — MICROSCOPIC EXAMINATION

## 2023-05-26 LAB — BLADDER SCAN AMB NON-IMAGING: Scan Result: 0

## 2023-05-26 NOTE — Progress Notes (Signed)
I, Tony Scott, acting as a scribe for Tony Altes, MD., have documented all relevant documentation on the behalf of Tony Altes, MD, as directed by Tony Altes, MD while in the presence of Tony Altes, MD.  05/26/2023 3:23 PM   Tony Scott June 13, 1961 [id number]  Referring provider: Allegra Grana, FNP [address] [place],  [place] [address]  Chief Complaint  Patient presents with   Benign Prostatic Hypertrophy   Urologic history 1. BPH with LUTS  Tamsulosin 0.4 mg daily  HPI: Tony Scott is a 62 y.o. male presents for annual follow-up.   I initially saw Tony Scott 05/14/2022 for lower urinary tract symptoms. His IPPS was 05/24/34. His symptoms were bothersome enough that he desired a trial of medical management and was started on tamsulosin 0.4 mg daily. He did note significant improvement in his lower urinary tract symptoms and has continued the medication.  Denies dysuria, gross hematuria.  Denies flank, abdominal, or pelvic pain. PSA drawn 09/21/2022 stable at 3.44  PSA trend   PSA  Latest Ref Rng 0.10 - 4.00 ng/ml  11/25/2018 2.9   09/18/2019 2.53   10/23/2019 2.50   09/09/2020 3.21   10/27/2021 3.04   09/21/2022 3.44     PMH: Past Medical History:  Diagnosis Date   Allergy    Hay fever   Anal fissure    Arthritis    Back pain, chronic    abnormality L4-5, per pt   Blood in stool    Colon polyp    Kidney stones     Surgical History: Past Surgical History:  Procedure Laterality Date   NOSE SURGERY      Home Medications:  Allergies as of 05/26/2023   No Known Allergies      Medication List        Accurate as of May 26, 2023  3:23 PM. If you have any questions, ask your nurse or doctor.          allopurinol 100 MG tablet Commonly known as: ZYLOPRIM TAKE 1 TABLET THREE TIMES A DAY   amLODipine 5 MG tablet Commonly known as: NORVASC TAKE 1 TABLET DAILY   ascorbic acid 1000 MG tablet Commonly  known as: VITAMIN C Take 1 tablet by mouth daily.   carvedilol 6.25 MG tablet Commonly known as: COREG TAKE 1 TABLET TWICE A DAY WITH MEALS   Cholecalciferol 25 MCG (1000 UT) tablet Take 1 tablet by mouth daily.   gabapentin 100 MG capsule Commonly known as: NEURONTIN Take 1 capsule (100 mg total) by mouth at bedtime.   hydrALAZINE 10 MG tablet Commonly known as: APRESOLINE TAKE 1 TABLET TWICE A DAY   loratadine 10 MG tablet Commonly known as: CLARITIN TAKE 1 TABLET DAILY   meloxicam 15 MG tablet Commonly known as: MOBIC TAKE 1 TABLET DAILY AS NEEDED FOR PAIN   tamsulosin 0.4 MG Caps capsule Commonly known as: FLOMAX Take 1 capsule (0.4 mg total) by mouth daily.   vitamin E 1000 UNIT capsule Take 1,000 Units by mouth daily.        Allergies: No Known Allergies  Family History: Family History  Problem Relation Age of Onset   Hypertension Mother    Hyperlipidemia Father    Heart disease Father 96   Hypertension Father    Diabetes Father    Rheum arthritis Sister    Arthritis Maternal Grandmother    Hyperlipidemia Maternal Grandmother    Hypertension  Maternal Grandmother    Heart disease Paternal Grandfather     Social History:  reports that he has never smoked. He has never used smokeless tobacco. He reports current alcohol use of about 8.0 standard drinks of alcohol per week. He reports that he does not use drugs.   Physical Exam: BP 132/81   Pulse 70   Ht 6' (1.829 m)   Wt 202 lb (91.6 kg)   BMI 27.40 kg/m   Constitutional:  Alert, No acute distress. HEENT: [place] AT Respiratory: Normal respiratory effort, no increased work of breathing. Psychiatric: Normal mood and affect.   Assessment & Plan:    1. BPH with LUTS  Symptom improvement on tamsulosin which he desires to continue.   2. Prostate cancer screening We discussed current recommendation prostate cancer screening for PSA and DRE from ages 39-69. I discussed that approximately 95% of  prostate cancers are diagnosed based on PSA alo, however 5% of the time DRE can be abnormal with a normal/stable PSA.  I offered him DRE today per screening recommendations, however he elected to forego the DRE this year.  He will continue annual follow-up.   I have reviewed the above documentation for accuracy and completeness, and I agree with the above.   Tony Altes, MD  [facility] [address] [place], [place] [address] [phone]

## 2023-06-21 ENCOUNTER — Other Ambulatory Visit: Payer: Self-pay | Admitting: Family

## 2023-06-21 DIAGNOSIS — I10 Essential (primary) hypertension: Secondary | ICD-10-CM

## 2023-07-14 ENCOUNTER — Other Ambulatory Visit: Payer: Self-pay | Admitting: Urology

## 2023-07-21 ENCOUNTER — Other Ambulatory Visit: Payer: Self-pay

## 2023-07-21 DIAGNOSIS — R200 Anesthesia of skin: Secondary | ICD-10-CM

## 2023-07-21 DIAGNOSIS — I10 Essential (primary) hypertension: Secondary | ICD-10-CM

## 2023-07-21 DIAGNOSIS — Z0000 Encounter for general adult medical examination without abnormal findings: Secondary | ICD-10-CM

## 2023-07-21 DIAGNOSIS — M109 Gout, unspecified: Secondary | ICD-10-CM

## 2023-07-21 MED ORDER — CARVEDILOL 6.25 MG PO TABS: 6.2500 mg | ORAL_TABLET | Freq: Two times a day (BID) | ORAL | 3 refills | Status: DC

## 2023-07-21 MED ORDER — GABAPENTIN 100 MG PO CAPS: 100.0000 mg | ORAL_CAPSULE | Freq: Every day | ORAL | 1 refills | Status: DC

## 2023-07-21 MED ORDER — ALLOPURINOL 100 MG PO TABS
100.0000 mg | ORAL_TABLET | Freq: Three times a day (TID) | ORAL | 3 refills | Status: DC
Start: 2023-07-21 — End: 2023-09-09

## 2023-07-21 MED ORDER — AMLODIPINE BESYLATE 5 MG PO TABS
5.0000 mg | ORAL_TABLET | Freq: Every day | ORAL | 3 refills | Status: DC
Start: 2023-07-21 — End: 2023-09-09

## 2023-08-04 ENCOUNTER — Encounter: Payer: Self-pay | Admitting: Family

## 2023-08-04 DIAGNOSIS — I10 Essential (primary) hypertension: Secondary | ICD-10-CM

## 2023-08-04 DIAGNOSIS — J302 Other seasonal allergic rhinitis: Secondary | ICD-10-CM

## 2023-08-04 MED ORDER — HYDRALAZINE HCL 10 MG PO TABS: 10.0000 mg | ORAL_TABLET | Freq: Two times a day (BID) | ORAL | 0 refills | Status: DC

## 2023-08-04 MED ORDER — TAMSULOSIN HCL 0.4 MG PO CAPS: 0.4000 mg | ORAL_CAPSULE | Freq: Every day | ORAL | 0 refills | Status: DC

## 2023-08-04 MED ORDER — LORATADINE 10 MG PO TABS: 10.0000 mg | ORAL_TABLET | Freq: Every day | ORAL | 0 refills | Status: DC

## 2023-09-02 NOTE — Telephone Encounter (Signed)
LVM to call back to office to discuss pt message below

## 2023-09-03 ENCOUNTER — Telehealth: Payer: Self-pay

## 2023-09-03 NOTE — Telephone Encounter (Signed)
Copied from CRM [id number]. Topic: Clinical - Medication Question >> Sep 03, 2023 11:38 AM [place] J wrote: Reason for CRM: Patient calling Medical Assistant Jenate back regarding prescription questions from yesterday. Patient would like a call back as soon as she's available.

## 2023-09-03 NOTE — Telephone Encounter (Signed)
Called pt to go over message below and he had just hung up with someone from our office  could not remember persons name and he verbalized understanding of message below. Has appt scheduled for 09/09/23

## 2023-09-03 NOTE — Telephone Encounter (Signed)
See previous note from 09/03/23

## 2023-09-03 NOTE — Telephone Encounter (Signed)
Spoke to pt he stated that he needs something else for his Allergies called in to local pharmacy that will not raise his Bp the way Claritin did , Mail order will not fill Allergy medication. Preferred Pharmacy is [facility] [address], [place], [place],[address]

## 2023-09-09 ENCOUNTER — Encounter: Payer: Self-pay | Admitting: Family

## 2023-09-09 ENCOUNTER — Ambulatory Visit: Payer: Managed Care, Other (non HMO) | Admitting: Family

## 2023-09-09 VITALS — BP 136/80 | HR 61 | Temp 97.7°F | Ht 72.0 in | Wt 208.0 lb

## 2023-09-09 DIAGNOSIS — M2550 Pain in unspecified joint: Secondary | ICD-10-CM

## 2023-09-09 DIAGNOSIS — M109 Gout, unspecified: Secondary | ICD-10-CM

## 2023-09-09 DIAGNOSIS — N401 Enlarged prostate with lower urinary tract symptoms: Secondary | ICD-10-CM

## 2023-09-09 DIAGNOSIS — J302 Other seasonal allergic rhinitis: Secondary | ICD-10-CM

## 2023-09-09 DIAGNOSIS — Z8639 Personal history of other endocrine, nutritional and metabolic disease: Secondary | ICD-10-CM

## 2023-09-09 DIAGNOSIS — Z13220 Encounter for screening for lipoid disorders: Secondary | ICD-10-CM

## 2023-09-09 DIAGNOSIS — D721 Eosinophilia, unspecified: Secondary | ICD-10-CM

## 2023-09-09 DIAGNOSIS — Z136 Encounter for screening for cardiovascular disorders: Secondary | ICD-10-CM

## 2023-09-09 DIAGNOSIS — I10 Essential (primary) hypertension: Secondary | ICD-10-CM

## 2023-09-09 DIAGNOSIS — Z0000 Encounter for general adult medical examination without abnormal findings: Secondary | ICD-10-CM

## 2023-09-09 LAB — CBC WITH DIFFERENTIAL/PLATELET
Basophils Absolute: 0.1 10*3/uL (ref 0.0–0.1)
Basophils Relative: 1 % (ref 0.0–3.0)
Eosinophils Absolute: 0.4 10*3/uL (ref 0.0–0.7)
Eosinophils Relative: 8.2 % — ABNORMAL HIGH (ref 0.0–5.0)
HCT: 47 % (ref 39.0–52.0)
Hemoglobin: 15.4 g/dL (ref 13.0–17.0)
Lymphocytes Relative: 32.2 % (ref 12.0–46.0)
Lymphs Abs: 1.7 10*3/uL (ref 0.7–4.0)
MCHC: 32.7 g/dL (ref 30.0–36.0)
MCV: 93.4 fL (ref 78.0–100.0)
Monocytes Absolute: 0.5 10*3/uL (ref 0.1–1.0)
Monocytes Relative: 9.2 % (ref 3.0–12.0)
Neutro Abs: 2.6 10*3/uL (ref 1.4–7.7)
Neutrophils Relative %: 49.4 % (ref 43.0–77.0)
Platelets: 204 10*3/uL (ref 150.0–400.0)
RBC: 5.03 Mil/uL (ref 4.22–5.81)
RDW: 13.9 % (ref 11.5–15.5)
WBC: 5.2 10*3/uL (ref 4.0–10.5)

## 2023-09-09 LAB — LIPID PANEL
Cholesterol: 218 mg/dL — ABNORMAL HIGH (ref 0–200)
HDL: 55.3 mg/dL (ref >39.00)
LDL Cholesterol: 146 mg/dL — ABNORMAL HIGH (ref 0–99)
NonHDL: 162.63
Total CHOL/HDL Ratio: 4
Triglycerides: 84 mg/dL (ref 0.0–149.0)
VLDL: 16.8 mg/dL (ref 0.0–40.0)

## 2023-09-09 LAB — COMPREHENSIVE METABOLIC PANEL
ALT: 22 U/L (ref 0–53)
AST: 18 U/L (ref 0–37)
Albumin: 4.6 g/dL (ref 3.5–5.2)
Alkaline Phosphatase: 47 U/L (ref 39–117)
BUN: 31 mg/dL — ABNORMAL HIGH (ref 6–23)
CO2: 28 meq/L (ref 19–32)
Calcium: 9.2 mg/dL (ref 8.4–10.5)
Chloride: 104 meq/L (ref 96–112)
Creatinine, Ser: 1.05 mg/dL (ref 0.40–1.50)
GFR: 76.81 mL/min (ref >60.00)
Glucose, Bld: 97 mg/dL (ref 70–99)
Potassium: 4.4 meq/L (ref 3.5–5.1)
Sodium: 139 meq/L (ref 135–145)
Total Bilirubin: 1 mg/dL (ref 0.2–1.2)
Total Protein: 7.3 g/dL (ref 6.0–8.3)

## 2023-09-09 LAB — TSH: TSH: 1.27 u[IU]/mL (ref 0.35–5.50)

## 2023-09-09 LAB — VITAMIN D 25 HYDROXY (VIT D DEFICIENCY, FRACTURES): VITD: 37.24 ng/mL (ref 30.00–100.00)

## 2023-09-09 LAB — URIC ACID: Uric Acid, Serum: 4.9 mg/dL (ref 4.0–7.8)

## 2023-09-09 LAB — PSA, MEDICARE: PSA: 3.71 ng/mL (ref 0.10–4.00)

## 2023-09-09 MED ORDER — HYDRALAZINE HCL 10 MG PO TABS: 10.0000 mg | ORAL_TABLET | Freq: Two times a day (BID) | ORAL | 0 refills | Status: DC

## 2023-09-09 MED ORDER — AMLODIPINE BESYLATE 5 MG PO TABS
5.0000 mg | ORAL_TABLET | Freq: Every day | ORAL | 3 refills | Status: AC
Start: 2023-09-09 — End: ?

## 2023-09-09 MED ORDER — LORATADINE 10 MG PO TABS: 10.0000 mg | ORAL_TABLET | Freq: Every day | ORAL | 3 refills | Status: DC

## 2023-09-09 MED ORDER — CARVEDILOL 6.25 MG PO TABS: 6.2500 mg | ORAL_TABLET | Freq: Two times a day (BID) | ORAL | 3 refills | Status: AC

## 2023-09-09 MED ORDER — ALLOPURINOL 100 MG PO TABS
100.0000 mg | ORAL_TABLET | Freq: Three times a day (TID) | ORAL | 3 refills | Status: AC
Start: 2023-09-09 — End: ?

## 2023-09-09 NOTE — Assessment & Plan Note (Signed)
 Chronic, symptomatically stable.  Continue allopurinol 100 three times daily. Pending uric acid.

## 2023-09-09 NOTE — Patient Instructions (Signed)
 As discussed, since you are taking meloxicam 15 mg quite regularly, I recommend starting omeprazole (Prilosec) over-the-counter to protect against developing a gastric ulcer.  Remember meloxicam is a rather potent anti-inflammatory in the same drug class as ibuprofen.  It is important to take with food.  Nice to see you!

## 2023-09-09 NOTE — Assessment & Plan Note (Signed)
 Chronic, well controlled.  Continue Coreg 6.25 mg twice a day, amlodipine 5 mg, hydralazine 10 mg BID ( BID dosing is preferred by patient).

## 2023-09-09 NOTE — Assessment & Plan Note (Addendum)
 Chronic bilateral knee pain.  He remains active.  He politely declines updating images or consult with orthopedics at this time.  Counseled on long-term risk, daily use of meloxicam 15 mg.  Advised him to be more moderate with use and to employ measures such as icing, neoprene sleeve.  Advised him to start omeprazole over-the-counter to protect from gastritis.  Continue meloxicam 15 mg every day prn.

## 2023-09-09 NOTE — Progress Notes (Signed)
 Assessment & Plan:  Benign prostatic hyperplasia with lower urinary tract symptoms, symptom details unspecified  Primary hypertension Assessment & Plan: Chronic, well controlled.  Continue Coreg 6.25 mg twice a day, amlodipine 5 mg, hydralazine 10 mg BID ( BID dosing is preferred by patient).  Orders: -     Comprehensive metabolic panel -     TSH -     CBC with Differential/Platelet  History of vitamin D deficiency -     VITAMIN D 25 Hydroxy (Vit-D Deficiency, Fractures) -     PSA, Medicare  Eosinophilia, unspecified type -     CBC with Differential/Platelet  Encounter for lipid screening for cardiovascular disease -     Lipid panel  Gout, unspecified cause, unspecified chronicity, unspecified site Assessment & Plan: Chronic, symptomatically stable.  Continue allopurinol 100 three times daily. Pending uric acid.   Orders: -     Allopurinol; Take 1 tablet (100 mg total) by mouth 3 (three) times daily.  Dispense: 270 tablet; Refill: 3 -     Uric acid  Routine general medical examination at a health care facility -     Allopurinol; Take 1 tablet (100 mg total) by mouth 3 (three) times daily.  Dispense: 270 tablet; Refill: 3  Essential hypertension -     amLODIPine Besylate; Take 1 tablet (5 mg total) by mouth daily.  Dispense: 90 tablet; Refill: 3 -     Carvedilol; Take 1 tablet (6.25 mg total) by mouth 2 (two) times daily with a meal.  Dispense: 180 tablet; Refill: 3 -     hydrALAZINE HCl; Take 1 tablet (10 mg total) by mouth 2 (two) times daily.  Dispense: 120 tablet; Refill: 0  Seasonal allergic rhinitis, unspecified trigger -     Loratadine; Take 1 tablet (10 mg total) by mouth daily.  Dispense: 90 tablet; Refill: 3  Arthralgia, unspecified joint Assessment & Plan: Chronic bilateral knee pain.  He remains active.  He politely declines updating images or consult with orthopedics at this time.  Counseled on long-term risk, daily use of meloxicam 15 mg.  Advised him to be  more moderate with use and to employ measures such as icing, neoprene sleeve.  Advised him to start omeprazole over-the-counter to protect from gastritis.  Continue meloxicam 15 mg every day prn.      Return precautions given.   Risks, benefits, and alternatives of the medications and treatment plan prescribed today were discussed, and patient expressed understanding.   Education regarding symptom management and diagnosis given to patient on AVS either electronically or printed.  Return in about 6 months (around 03/08/2024) for Complete Physical Exam.  Rennie Plowman, FNP  Subjective:    Patient ID: Tony Scott, male    DOB: 1961-06-14, 63 y.o.   MRN: [id number]  CC: Tony Scott is a 63 y.o. male who presents today for follow up.   HPI: Feels well today.  No new complaints.  He is active playing golf a couple days per week.  Denies chest pain, shortness of breath.  Chronic bilateral knee pain which is largely unchanged.  He is taking meloxicam 15 mg most days for this.  No history of PUD    Allergies: Patient has no known allergies. Current Outpatient Medications on File Prior to Visit  Medication Sig Dispense Refill   ascorbic acid (VITAMIN C) 1000 MG tablet Take 1 tablet by mouth daily.     Cholecalciferol 25 MCG (1000 UT) tablet Take 1 tablet by mouth  daily.     meloxicam (MOBIC) 15 MG tablet TAKE 1 TABLET DAILY AS NEEDED FOR PAIN 90 tablet 3   tamsulosin (FLOMAX) 0.4 MG CAPS capsule Take 1 capsule (0.4 mg total) by mouth daily. 90 capsule 0   vitamin E 1000 UNIT capsule Take 1,000 Units by mouth daily.     No current facility-administered medications on file prior to visit.    Review of Systems  Constitutional:  Negative for chills and fever.  Respiratory:  Negative for cough.   Cardiovascular:  Negative for chest pain and palpitations.  Gastrointestinal:  Negative for nausea and vomiting.  Musculoskeletal:  Positive for arthralgias (BL knees).      Objective:     BP 136/80   Pulse 61   Temp 97.7 F (36.5 C) (Oral)   Ht 6' (1.829 m)   Wt 208 lb (94.3 kg)   SpO2 96%   BMI 28.21 kg/m  BP Readings from Last 3 Encounters:  09/09/23 136/80  05/26/23 132/81  09/21/22 130/78   Wt Readings from Last 3 Encounters:  09/09/23 208 lb (94.3 kg)  05/26/23 202 lb (91.6 kg)  09/21/22 206 lb 12.8 oz (93.8 kg)    Physical Exam Vitals reviewed.  Constitutional:      Appearance: He is well-developed.  Cardiovascular:     Rate and Rhythm: Regular rhythm.     Heart sounds: Normal heart sounds.  Pulmonary:     Effort: Pulmonary effort is normal. No respiratory distress.     Breath sounds: Normal breath sounds. No wheezing, rhonchi or rales.  Skin:    General: Skin is warm and dry.  Neurological:     Mental Status: He is alert.  Psychiatric:        Speech: Speech normal.        Behavior: Behavior normal.

## 2023-09-10 ENCOUNTER — Encounter: Payer: Self-pay | Admitting: Family

## 2023-10-05 ENCOUNTER — Other Ambulatory Visit: Payer: Self-pay | Admitting: Family

## 2023-11-09 ENCOUNTER — Encounter: Payer: Self-pay | Admitting: Family

## 2023-11-10 ENCOUNTER — Other Ambulatory Visit: Payer: Self-pay | Admitting: Family

## 2023-11-10 MED ORDER — TAMSULOSIN HCL 0.4 MG PO CAPS: 0.4000 mg | ORAL_CAPSULE | Freq: Every day | ORAL | 3 refills | Status: DC

## 2023-11-11 ENCOUNTER — Other Ambulatory Visit: Payer: Self-pay | Admitting: Family

## 2023-11-11 DIAGNOSIS — I10 Essential (primary) hypertension: Secondary | ICD-10-CM

## 2024-03-19 ENCOUNTER — Other Ambulatory Visit: Payer: Self-pay | Admitting: Family

## 2024-03-19 DIAGNOSIS — M2550 Pain in unspecified joint: Secondary | ICD-10-CM

## 2024-05-09 ENCOUNTER — Telehealth: Payer: Self-pay

## 2024-05-09 NOTE — Telephone Encounter (Signed)
 LVM to call back to inquire if he has switched Pharmacies, we received refill request from [facility]. Just need to confirm if you requested the medication refill for the Loratdine 10 mg via [facility]

## 2024-05-10 NOTE — Telephone Encounter (Unsigned)
 Copied from CRM [id number]. Topic: General - Other >> May 10, 2024 10:07 AM Macario HERO wrote: Reason for CRM: Patient called regarding missed call from [facility]. Advised reason for call and patient confirm he recently switched to [facility].

## 2024-05-10 NOTE — Telephone Encounter (Signed)
 Noted

## 2024-05-24 ENCOUNTER — Encounter: Payer: Self-pay | Admitting: Urology

## 2024-05-24 ENCOUNTER — Ambulatory Visit: Payer: Self-pay | Admitting: Urology

## 2024-05-24 VITALS — BP 146/82 | HR 101 | Ht 72.0 in | Wt 207.0 lb

## 2024-05-24 DIAGNOSIS — N401 Enlarged prostate with lower urinary tract symptoms: Secondary | ICD-10-CM | POA: Diagnosis not present

## 2024-05-24 LAB — URINALYSIS, COMPLETE
Bilirubin, UA: NEGATIVE
Glucose, UA: NEGATIVE
Ketones, UA: NEGATIVE
Leukocytes,UA: NEGATIVE
Nitrite, UA: NEGATIVE
Protein,UA: NEGATIVE
Specific Gravity, UA: 1.025 (ref 1.005–1.030)
Urobilinogen, Ur: 0.2 mg/dL (ref 0.2–1.0)
pH, UA: 6 (ref 5.0–7.5)

## 2024-05-24 LAB — BLADDER SCAN AMB NON-IMAGING: Scan Result: 0

## 2024-05-24 LAB — MICROSCOPIC EXAMINATION: Bacteria, UA: NONE SEEN

## 2024-05-24 MED ORDER — TAMSULOSIN HCL 0.4 MG PO CAPS: 0.4000 mg | ORAL_CAPSULE | Freq: Every day | ORAL | 3 refills | Status: AC

## 2024-05-24 NOTE — Progress Notes (Signed)
 05/24/2024 1:19 PM   Tony Scott 04/26/1961 [id number]  Referring provider: Dineen Rollene MATSU, FNP [address] [place],  [place] [address]  Chief Complaint  Patient presents with   Benign Prostatic Hypertrophy   Urologic history  1. BPH with LUTS  Tamsulosin  0.4 mg daily   HPI: Tony Scott is a 63 y.o. male who presents for annual follow-up.  No problems since last year's visit Stable lower urinary tract symptoms IPSS today 14/35 with bother rated 2/6 PSA 09/09/2023 was 3.71  PSA trend    PSA  Latest Ref Rng 0.10 - 4.00 ng/ml  11/25/2018 2.9   09/18/2019 2.53   10/23/2019 2.50   09/09/2020 3.21   10/27/2021 3.04   09/21/2022 3.44   09/09/2023 3.71     PMH: Past Medical History:  Diagnosis Date   Allergy    Hay fever   Anal fissure    Arthritis    Back pain, chronic    abnormality L4-5, per pt   Blood in stool    Colon polyp    Kidney stones     Surgical History: Past Surgical History:  Procedure Laterality Date   NOSE SURGERY      Home Medications:  Allergies as of 05/24/2024   No Known Allergies      Medication List        Accurate as of May 24, 2024  1:19 PM. If you have any questions, ask your nurse or doctor.          allopurinol  100 MG tablet Commonly known as: ZYLOPRIM  Take 1 tablet (100 mg total) by mouth 3 (three) times daily.   amLODipine  5 MG tablet Commonly known as: NORVASC  Take 1 tablet (5 mg total) by mouth daily.   ascorbic acid 1000 MG tablet Commonly known as: VITAMIN C Take 1 tablet by mouth daily.   carvedilol  6.25 MG tablet Commonly known as: COREG  Take 1 tablet (6.25 mg total) by mouth 2 (two) times daily with a meal.   Cholecalciferol 25 MCG (1000 UT) tablet Take 1 tablet by mouth daily.   hydrALAZINE  10 MG tablet Commonly known as: APRESOLINE  TAKE 1 TABLET BY MOUTH TWICE  DAILY   loratadine  10 MG tablet Commonly known as: CLARITIN  Take 1 tablet (10 mg total) by mouth daily.    meloxicam  15 MG tablet Commonly known as: MOBIC  TAKE 1 TABLET DAILY AS NEEDED  FOR PAIN   tamsulosin  0.4 MG Caps capsule Commonly known as: FLOMAX  Take 1 capsule (0.4 mg total) by mouth daily.   vitamin E 1000 UNIT capsule Take 1,000 Units by mouth daily.        Allergies: No Known Allergies  Family History: Family History  Problem Relation Age of Onset   Hypertension Mother    Hyperlipidemia Father    Heart disease Father 52   Hypertension Father    Diabetes Father    Rheum arthritis Sister    Arthritis Maternal Grandmother    Hyperlipidemia Maternal Grandmother    Hypertension Maternal Grandmother    Heart disease Paternal Grandfather     Social History:  reports that he has never smoked. He has never used smokeless tobacco. He reports current alcohol use of about 8.0 standard drinks of alcohol per week. He reports that he does not use drugs.   Physical Exam: BP (!) 146/82   Pulse (!) 101   Ht 6' (1.829 m)   Wt 207 lb (93.9 kg)   BMI 28.07 kg/m  Constitutional:  Alert, No acute distress. HEENT: [place] AT Respiratory: Normal respiratory effort, no increased work of breathing. GU: Has declined DRE as long as PSA stable Psychiatric: Normal mood and affect.  Laboratory Data:  Urinalysis Microscopy negative   Assessment & Plan:    1. Benign prostatic hyperplasia with LUTS Stable PVR 0 mL Tamsulosin  refilled Continue annual follow-up   Tony JAYSON Barba, MD  [facility] [address] [place], [place] [address] [phone]

## 2024-06-07 ENCOUNTER — Other Ambulatory Visit: Payer: Self-pay | Admitting: Family

## 2024-06-07 DIAGNOSIS — J302 Other seasonal allergic rhinitis: Secondary | ICD-10-CM

## 2024-06-07 NOTE — Telephone Encounter (Signed)
 Copied from CRM #[id number]. Topic: Clinical - Prescription Issue >> Jun 07, 2024 10:38 AM Viola FALCON wrote: Reason for CRM: Patient called regarding the loratadine  (CLARITIN ) 10 MG tablet - he needs it resent to the [facility] [address] [place] - [address] ... They never received it back when he called on 05/10/24. He would like a MyChart message once medication is sent.

## 2024-06-12 ENCOUNTER — Other Ambulatory Visit: Payer: Self-pay | Admitting: Family

## 2024-06-13 NOTE — Telephone Encounter (Signed)
 LVM to call back to discuss message below also to see if he needs Claritin  called in?

## 2024-06-14 ENCOUNTER — Other Ambulatory Visit: Payer: Self-pay

## 2024-06-14 DIAGNOSIS — J302 Other seasonal allergic rhinitis: Secondary | ICD-10-CM

## 2024-06-14 MED ORDER — LORATADINE 10 MG PO TABS: 10.0000 mg | ORAL_TABLET | Freq: Every day | ORAL | 3 refills | Status: AC

## 2025-05-22 ENCOUNTER — Ambulatory Visit: Admitting: Urology

## 2025-05-25 ENCOUNTER — Ambulatory Visit: Admitting: Urology
